# Patient Record
Sex: Female | Born: 1950 | Race: White | Hispanic: No | Marital: Married | Smoking: Former smoker
Health system: Southern US, Community
[De-identification: ages and names within clinical notes are randomized; demographics above are authoritative.]

## PROBLEM LIST (undated history)

## (undated) DIAGNOSIS — F419 Anxiety disorder, unspecified: Secondary | ICD-10-CM

## (undated) DIAGNOSIS — G43909 Migraine, unspecified, not intractable, without status migrainosus: Secondary | ICD-10-CM

## (undated) DIAGNOSIS — K589 Irritable bowel syndrome without diarrhea: Secondary | ICD-10-CM

## (undated) DIAGNOSIS — F5104 Psychophysiologic insomnia: Secondary | ICD-10-CM

## (undated) DIAGNOSIS — E7800 Pure hypercholesterolemia, unspecified: Secondary | ICD-10-CM

## (undated) DIAGNOSIS — G44209 Tension-type headache, unspecified, not intractable: Secondary | ICD-10-CM

## (undated) DIAGNOSIS — D649 Anemia, unspecified: Secondary | ICD-10-CM

## (undated) DIAGNOSIS — N83202 Unspecified ovarian cyst, left side: Principal | ICD-10-CM

## (undated) DIAGNOSIS — J40 Bronchitis, not specified as acute or chronic: Secondary | ICD-10-CM

## (undated) DIAGNOSIS — G479 Sleep disorder, unspecified: Secondary | ICD-10-CM

## (undated) DIAGNOSIS — E041 Nontoxic single thyroid nodule: Secondary | ICD-10-CM

## (undated) DIAGNOSIS — M810 Age-related osteoporosis without current pathological fracture: Secondary | ICD-10-CM

## (undated) DIAGNOSIS — G44229 Chronic tension-type headache, not intractable: Secondary | ICD-10-CM

## (undated) DIAGNOSIS — N200 Calculus of kidney: Secondary | ICD-10-CM

## (undated) HISTORY — PX: BREAST BIOPSY: SHX20

## (undated) HISTORY — DX: Migraine, unspecified, not intractable, without status migrainosus: G43.909

## (undated) HISTORY — DX: Tension-type headache, unspecified, not intractable: G44.209

## (undated) HISTORY — DX: Irritable bowel syndrome, unspecified: K58.9

## (undated) HISTORY — DX: Chronic tension-type headache, not intractable: G44.229

## (undated) HISTORY — DX: Bronchitis, not specified as acute or chronic: J40

## (undated) HISTORY — DX: Nontoxic single thyroid nodule: E04.1

## (undated) HISTORY — DX: Pure hypercholesterolemia, unspecified: E78.00

## (undated) HISTORY — DX: Calculus of kidney: N20.0

## (undated) HISTORY — DX: Psychophysiologic insomnia: F51.04

## (undated) HISTORY — DX: Sleep disorder, unspecified: G47.9

## (undated) HISTORY — PX: BREAST EXCISIONAL BIOPSY: SUR124

## (undated) HISTORY — DX: Age-related osteoporosis without current pathological fracture: M81.0

## (undated) HISTORY — PX: EYE SURGERY: SHX253

## (undated) HISTORY — DX: Anemia, unspecified: D64.9

## (undated) HISTORY — DX: Unspecified ovarian cyst, left side: N83.202

## (undated) HISTORY — DX: Anxiety disorder, unspecified: F41.9

---

## 1998-03-23 ENCOUNTER — Other Ambulatory Visit: Admission: RE | Admit: 1998-03-23 | Discharge: 1998-03-23 | Payer: Self-pay | Admitting: Obstetrics and Gynecology

## 1999-07-08 ENCOUNTER — Other Ambulatory Visit: Admission: RE | Admit: 1999-07-08 | Discharge: 1999-07-08 | Payer: Self-pay | Admitting: Obstetrics and Gynecology

## 1999-11-30 ENCOUNTER — Encounter: Admission: RE | Admit: 1999-11-30 | Discharge: 1999-11-30 | Payer: Self-pay | Admitting: Obstetrics and Gynecology

## 1999-11-30 ENCOUNTER — Encounter: Payer: Self-pay | Admitting: Obstetrics and Gynecology

## 2000-09-25 ENCOUNTER — Other Ambulatory Visit: Admission: RE | Admit: 2000-09-25 | Discharge: 2000-09-25 | Payer: Self-pay | Admitting: Obstetrics and Gynecology

## 2000-12-26 ENCOUNTER — Encounter: Admission: RE | Admit: 2000-12-26 | Discharge: 2000-12-26 | Payer: Self-pay | Admitting: Obstetrics and Gynecology

## 2000-12-26 ENCOUNTER — Encounter: Payer: Self-pay | Admitting: Obstetrics and Gynecology

## 2001-10-02 ENCOUNTER — Other Ambulatory Visit: Admission: RE | Admit: 2001-10-02 | Discharge: 2001-10-02 | Payer: Self-pay | Admitting: Obstetrics and Gynecology

## 2002-03-22 ENCOUNTER — Encounter: Payer: Self-pay | Admitting: Obstetrics and Gynecology

## 2002-03-22 ENCOUNTER — Encounter: Admission: RE | Admit: 2002-03-22 | Discharge: 2002-03-22 | Payer: Self-pay | Admitting: Obstetrics and Gynecology

## 2002-10-24 ENCOUNTER — Other Ambulatory Visit: Admission: RE | Admit: 2002-10-24 | Discharge: 2002-10-24 | Payer: Self-pay | Admitting: Obstetrics and Gynecology

## 2003-11-26 ENCOUNTER — Encounter: Admission: RE | Admit: 2003-11-26 | Discharge: 2003-11-26 | Payer: Self-pay | Admitting: Obstetrics and Gynecology

## 2003-12-09 ENCOUNTER — Other Ambulatory Visit: Admission: RE | Admit: 2003-12-09 | Discharge: 2003-12-09 | Payer: Self-pay | Admitting: Obstetrics and Gynecology

## 2004-12-28 ENCOUNTER — Encounter: Admission: RE | Admit: 2004-12-28 | Discharge: 2004-12-28 | Payer: Self-pay | Admitting: Obstetrics and Gynecology

## 2006-01-23 ENCOUNTER — Encounter: Admission: RE | Admit: 2006-01-23 | Discharge: 2006-01-23 | Payer: Self-pay | Admitting: Obstetrics and Gynecology

## 2007-03-21 ENCOUNTER — Other Ambulatory Visit: Admission: RE | Admit: 2007-03-21 | Discharge: 2007-03-21 | Payer: Self-pay | Admitting: Obstetrics and Gynecology

## 2007-04-04 ENCOUNTER — Encounter: Admission: RE | Admit: 2007-04-04 | Discharge: 2007-04-04 | Payer: Self-pay | Admitting: Obstetrics and Gynecology

## 2008-03-31 ENCOUNTER — Other Ambulatory Visit: Admission: RE | Admit: 2008-03-31 | Discharge: 2008-03-31 | Payer: Self-pay | Admitting: Obstetrics and Gynecology

## 2008-04-15 ENCOUNTER — Encounter: Admission: RE | Admit: 2008-04-15 | Discharge: 2008-04-15 | Payer: Self-pay | Admitting: Obstetrics and Gynecology

## 2009-08-06 ENCOUNTER — Encounter: Admission: RE | Admit: 2009-08-06 | Discharge: 2009-08-06 | Payer: Self-pay | Admitting: Obstetrics and Gynecology

## 2009-10-30 ENCOUNTER — Encounter: Admission: RE | Admit: 2009-10-30 | Discharge: 2009-10-30 | Payer: Self-pay | Admitting: Family Medicine

## 2010-09-16 ENCOUNTER — Encounter: Admission: RE | Admit: 2010-09-16 | Discharge: 2010-09-16 | Payer: Self-pay | Admitting: Obstetrics and Gynecology

## 2010-12-12 ENCOUNTER — Encounter: Payer: Self-pay | Admitting: Obstetrics and Gynecology

## 2011-01-04 ENCOUNTER — Other Ambulatory Visit: Payer: Self-pay | Admitting: Internal Medicine

## 2011-01-04 DIAGNOSIS — E042 Nontoxic multinodular goiter: Secondary | ICD-10-CM

## 2011-01-13 ENCOUNTER — Other Ambulatory Visit: Payer: Self-pay

## 2011-09-26 ENCOUNTER — Other Ambulatory Visit: Payer: Self-pay | Admitting: Obstetrics and Gynecology

## 2011-09-26 DIAGNOSIS — Z1231 Encounter for screening mammogram for malignant neoplasm of breast: Secondary | ICD-10-CM

## 2011-09-27 ENCOUNTER — Ambulatory Visit: Payer: Self-pay

## 2011-12-06 ENCOUNTER — Ambulatory Visit: Payer: Self-pay

## 2011-12-13 ENCOUNTER — Ambulatory Visit
Admission: RE | Admit: 2011-12-13 | Discharge: 2011-12-13 | Disposition: A | Discharge status: Home / Self Care | Payer: BC Managed Care – PPO | Source: Ambulatory Visit | Attending: Obstetrics and Gynecology | Admitting: Obstetrics and Gynecology

## 2011-12-13 DIAGNOSIS — Z1231 Encounter for screening mammogram for malignant neoplasm of breast: Secondary | ICD-10-CM

## 2013-05-28 ENCOUNTER — Encounter: Payer: Self-pay | Admitting: Obstetrics & Gynecology

## 2013-05-29 ENCOUNTER — Ambulatory Visit: Payer: Self-pay | Admitting: Obstetrics & Gynecology

## 2013-05-29 ENCOUNTER — Telehealth: Payer: Self-pay | Admitting: Obstetrics & Gynecology

## 2013-05-29 NOTE — Telephone Encounter (Signed)
Patient's husband cancelled her appointment for today with Dr. Hyacinth Meeker for aex due to patient was admitted to the hospital yesterday. Patient's husband did not give details but stated she is stabile and will call to reschedule when she get home. Patient in recall.

## 2013-05-31 ENCOUNTER — Ambulatory Visit: Payer: Self-pay | Admitting: Obstetrics & Gynecology

## 2013-06-26 ENCOUNTER — Encounter: Payer: Self-pay | Admitting: Obstetrics and Gynecology

## 2013-06-26 ENCOUNTER — Ambulatory Visit (INDEPENDENT_AMBULATORY_CARE_PROVIDER_SITE_OTHER): Payer: BC Managed Care – PPO | Admitting: Obstetrics and Gynecology

## 2013-06-26 VITALS — BP 112/60 | HR 72 | Ht 64.25 in | Wt 119.5 lb

## 2013-06-26 DIAGNOSIS — N83209 Unspecified ovarian cyst, unspecified side: Secondary | ICD-10-CM

## 2013-06-26 DIAGNOSIS — Z0000 Encounter for general adult medical examination without abnormal findings: Secondary | ICD-10-CM

## 2013-06-26 DIAGNOSIS — Z01419 Encounter for gynecological examination (general) (routine) without abnormal findings: Secondary | ICD-10-CM

## 2013-06-26 LAB — POCT URINALYSIS DIPSTICK
Glucose, UA: NEGATIVE
Nitrite, UA: NEGATIVE
Urobilinogen, UA: NEGATIVE

## 2013-06-26 NOTE — Progress Notes (Signed)
Patient ID: Chelsea Lowe, female   DOB: 11/27/1950, 62 y.o.   MRN: [id number] 62 y.o.   Married    Caucasian   female   [phone]   here for annual exam.    Had a CT scan following colonoscopy for which patient had significant post procedure pain.  Showed an ovarian cyst.  Patient is asking to wait to do this follow up.   Nocturia every 2 hours during the night.  During the day able to go longer in between visits.   No bladder urgency. No urinary incontinence.   Drinks water.  [place]zer water use during the last 4 months.   States a history of possible glaucoma.   Has an appointment with her opthalmologist next week.    No history of an arrythmia.    Some difficulty sleeping.  Denies caffeine use and ETOH use.   No hot flashes.   Asking for refill on Ambien. We filled it in 2013.  Patient had called for a refill and we declined without an office visit due to her rapid use of the medication. She then filled with her PCP.  Patient's last menstrual period was 11/21/2001.          Sexually active: no  The current method of family planning is post menopausal status.    Exercising: walking, hiking and biking Last mammogram:  11/2011 wnl:[facility] Last pap smear: 04/2012 wnl:Neg HR HPV History of abnormal pap: no Smoking: no Alcohol: 2 glasses red wine per week. Last colonoscopy: 05-28-13 wnl:Dr. Roxan Hockey in [place].  Patient developed severe pain day after procedure, had CT-Scan which did not reveal perforation but revealed ovarian cyst and was told to follow-up with GYN. Last Bone Density:  2013--Osteoporosis:[facility].  Follow by her PCP for this.  Patient is doing two injections per year.   Last tetanus shot: unsure but sees PCP yearly Last cholesterol check:  10/2012 wnl  Urine: Neg  Labs with her PCP   Family History  Problem Relation Age of Onset  . Cancer Maternal Grandfather     ? liver  . Hypertension Mother   . Transient ischemic attack Mother   .  Hypertension Father     There are no active problems to display for this patient.   Past Medical History  Diagnosis Date  . Kidney stones     h/o  . Migraines     h/o  . IBS (irritable bowel syndrome)     and colitis  . Osteoporosis     followed by PCP-on prolia  . Anemia     h/o    Past Surgical History  Procedure Laterality Date  . Breast biopsy      bilateral    Allergies: Codeine  Current Outpatient Prescriptions  Medication Sig Dispense Refill  . aspirin 81 MG tablet Take 81 mg by mouth daily.      Marland Kitchen CALCIUM PO Take by mouth 2 (two) times daily.      . cholecalciferol (VITAMIN D) 1000 UNITS tablet Take 1,000 Units by mouth daily.      Marland Kitchen denosumab (PROLIA) 60 MG/ML SOLN injection Inject 60 mg into the skin every 6 (six) months. Administer in upper arm, thigh, or abdomen      . Multiple Vitamins-Minerals (MULTIVITAMIN PO) Take by mouth daily.      . Omega-3 Fatty Acids (FISH OIL PO) Take by mouth 3 (three) times daily.      Marland Kitchen zolpidem (AMBIEN CR) 6.25 MG  CR tablet Take 6.25 mg by mouth at bedtime as needed for sleep.      Marland Kitchen MELATONIN PO Take by mouth. Occasionally       No current facility-administered medications for this visit.    ROS: Pertinent items are noted in HPI.  Social Hx:  Retired.  One child 28 years old, living in [place].  Exam:    BP 112/60  Pulse 72  Ht 5' 4.25" (1.632 m)  Wt 119 lb 8 oz (54.205 kg)  BMI 20.35 kg/m2  LMP 11/21/2001   Wt Readings from Last 3 Encounters:  06/26/13 119 lb 8 oz (54.205 kg)     Ht Readings from Last 3 Encounters:  06/26/13 5' 4.25" (1.632 m)    General appearance: alert, cooperative and appears stated age Head: Normocephalic, without obvious abnormality, atraumatic Neck: no adenopathy, supple, symmetrical, trachea midline and thyroid not enlarged, symmetric, no tenderness/mass/nodules Lungs: clear to auscultation bilaterally Breasts: Inspection negative, No nipple retraction or dimpling, No nipple  discharge or bleeding, No axillary or supraclavicular adenopathy, Normal to palpation without dominant masses.  Scars of both breasts upper/outer quadrants. Heart: regular rate and rhythm Abdomen: soft, non-tender;  no masses,  no organomegaly Extremities: extremities normal, atraumatic, no cyanosis or edema Skin: Skin color, texture, turgor normal. No rashes or lesions Lymph nodes: Cervical, supraclavicular, and axillary nodes normal. No abnormal inguinal nodes palpated Neurologic: Grossly normal   Pelvic: External genitalia:  no lesions              Urethra:  normal appearing urethra with no masses, tenderness or lesions              Bartholins and Skenes: normal                 Vagina: normal appearing vagina with normal color and discharge, no lesions              Cervix: normal appearance              Pap taken: no        Bimanual Exam:  Uterus:  uterus is normal size, shape, consistency and nontender                                      Adnexa: normal adnexa in size, nontender and no masses                                      Rectovaginal: Confirms                                      Anus:  normal sphincter tone, no lesions  A: normal menopausal exam Insomnia Nocturia Ovarian cyst History of gluacoma       P[facility]  Order placed. Patient will call. pap smear in 2018.  Patient informed. Return for pelvic ultrasound Patient will get CT scan report for [place].  Patient will check with her opthalmologist regarding her glaucoma history. If she does not have narrow angle glaucoma, will prescribe medication for overeactive bladder such as Detrol or Ditropan. Sleep meds to be given through one prescriber - PCP  return annually or prn  An After Visit Summary was printed and given to the patient.

## 2013-06-26 NOTE — Patient Instructions (Signed)

## 2013-07-05 ENCOUNTER — Telehealth: Payer: Self-pay | Admitting: Obstetrics and Gynecology

## 2013-07-05 NOTE — Telephone Encounter (Signed)
Spoke with patient about her ins benefits for a PUS. Patient agreed and said she will call back on Monday to schedule. Her husband has an appointment on Monday to find out when his surgery will be. She wants to have his surgery scheduled before scheduling a PUS.

## 2013-07-16 ENCOUNTER — Ambulatory Visit: Payer: BC Managed Care – PPO

## 2013-07-19 NOTE — Telephone Encounter (Signed)
Patient scheduled 9/11/4

## 2013-08-01 ENCOUNTER — Encounter: Payer: Self-pay | Admitting: Obstetrics and Gynecology

## 2013-08-01 ENCOUNTER — Ambulatory Visit (INDEPENDENT_AMBULATORY_CARE_PROVIDER_SITE_OTHER): Payer: BC Managed Care – PPO | Admitting: Obstetrics and Gynecology

## 2013-08-01 ENCOUNTER — Ambulatory Visit (INDEPENDENT_AMBULATORY_CARE_PROVIDER_SITE_OTHER): Payer: BC Managed Care – PPO

## 2013-08-01 VITALS — BP 118/68 | HR 80 | Ht 64.25 in | Wt 121.0 lb

## 2013-08-01 DIAGNOSIS — N83209 Unspecified ovarian cyst, unspecified side: Secondary | ICD-10-CM

## 2013-08-01 DIAGNOSIS — N3281 Overactive bladder: Secondary | ICD-10-CM

## 2013-08-01 DIAGNOSIS — N83202 Unspecified ovarian cyst, left side: Secondary | ICD-10-CM

## 2013-08-01 DIAGNOSIS — N318 Other neuromuscular dysfunction of bladder: Secondary | ICD-10-CM

## 2013-08-01 MED ORDER — OXYBUTYNIN CHLORIDE ER 5 MG PO TB24: 5.0000 mg | ORAL_TABLET | Freq: Every day | ORAL | Status: DC

## 2013-08-01 NOTE — Patient Instructions (Addendum)
Oxybutynin extended-release tablets What is this medicine? OXYBUTYNIN (ox i BYOO ti nin) is used to treat overactive bladder. This medicine reduces the amount of bathroom visits. It may also help to control wetting accidents. This medicine may be used for other purposes; ask your health care provider or pharmacist if you have questions. What should I tell my health care provider before I take this medicine? They need to know if you have any of these conditions: -dementia -difficulty passing urine -glaucoma -intestinal obstruction -kidney disease -liver disease -an unusual or allergic reaction to oxybutynin, other medicines, foods, dyes, or preservatives -pregnant or trying to get pregnant -breast-feeding How should I use this medicine? Take this medicine by mouth with a glass of water. Swallow whole, do not crush, cut, or chew. Follow the directions on the prescription label. You can take this medicine with or without food. Take your doses at regular intervals. Do not take your medicine more often than directed. Talk to your pediatrician regarding the use of this medicine in children. Special care may be needed. While this drug may be prescribed for children as young as 6 years for selected conditions, precautions do apply. Overdosage: If you think you have taken too much of this medicine contact a poison control center or emergency room at once. NOTE: This medicine is only for you. Do not share this medicine with others. What if I miss a dose? If you miss a dose, take it as soon as you can. If it is almost time for your next dose, take only that dose. Do not take double or extra doses. What may interact with this medicine? -antihistamines for allergy, cough and cold -atropine -certain medicines for bladder problems like oxybutynin, tolterodine -certain medicines for Parkinson's disease like benztropine, trihexyphenidyl -certain medicines for stomach problems like dicyclomine,  hyoscyamine -certain medicines for travel sickness like scopolamine -clarithromycin -erythromycin -ipratropium -medicines for fungal infections, like fluconazole, itraconazole, ketoconazole or voriconazole This list may not describe all possible interactions. Give your health care provider a list of all the medicines, herbs, non-prescription drugs, or dietary supplements you use. Also tell them if you smoke, drink alcohol, or use illegal drugs. Some items may interact with your medicine. What should I watch for while using this medicine? It may take a few weeks to notice the full benefit from this medicine. You may need to limit your intake tea, coffee, caffeinated sodas, and alcohol. These drinks may make your symptoms worse. You may get drowsy or dizzy. Do not drive, use machinery, or do anything that needs mental alertness until you know how this medicine affects you. Do not stand or sit up quickly, especially if you are an older patient. This reduces the risk of dizzy or fainting spells. Alcohol may interfere with the effect of this medicine. Avoid alcoholic drinks. Your mouth may get dry. Chewing sugarless gum or sucking hard candy, and drinking plenty of water may help. Contact your doctor if the problem does not go away or is severe. This medicine may cause dry eyes and blurred vision. If you wear contact lenses, you may feel some discomfort. Lubricating drops may help. See your eyecare professional if the problem does not go away or is severe. You may notice the shells of the tablets in your stool from time to time. This is normal. Avoid extreme heat. This medicine can cause you to sweat less than normal. Your body temperature could increase to dangerous levels, which may lead to heat stroke. What side effects may  I notice from receiving this medicine? Side effects that you should report to your doctor or health care professional as soon as possible: -allergic reactions like skin rash,  itching or hives, swelling of the face, lips, or tongue -agitation -breathing problems -confusion -fever -flushing (reddening of the skin) -hallucinations -memory loss -pain or difficulty passing urine -palpitations -unusually weak or tired Side effects that usually do not require medical attention (report to your doctor or health care professional if they continue or are bothersome): -constipation -headache -sexual difficulties (impotence) This list may not describe all possible side effects. Call your doctor for medical advice about side effects. You may report side effects to FDA at 1-800-FDA-1088. Where should I keep my medicine? Keep out of the reach of children. Store at room temperature between 15 and 30 degrees C (59 and 86 degrees F). Protect from moisture and humidity. Throw away any unused medicine after the expiration date. NOTE: This sheet is a summary. It may not cover all possible information. If you have questions about this medicine, talk to your doctor, pharmacist, or health care provider.  2013, [employer]. (06/04/2008 11:11:20 AM)  Overactive Bladder, Adult The bladder has two functions that are totally opposite of the other. One is to relax and stretch out so it can store urine (fills like a balloon), and the other is to contract and squeeze down so that it can empty the urine that it has stored. Proper functioning of the bladder is a complex mixing of these two functions. The filling and emptying of the bladder can be influenced by:  The bladder.  The spinal cord.  The brain.  The nerves going to the bladder.  Other organs that are closely related to the bladder such as prostate in males and the vagina in females. As your bladder fills with urine, nerve signals are sent from the bladder to the brain to tell you that you may need to urinate. Normal urination requires that the bladder squeeze down with sufficient strength to empty the bladder, but  this also requires that the bladder squeeze down sufficiently long to finish the job. In addition the sphincter muscles, which normally keep you from leaking urine, must also relax so that the urine can pass. Coordination between the bladder muscle squeezing down and the sphincter muscles relaxing is required to make everything happen normally. With an overactive bladder sometimes the muscles of the bladder contract unexpectedly and involuntarily and this causes an urgent need to urinate. The normal response is to try to hold urine in by contracting the sphincter muscles. Sometimes the bladder contracts so strongly that the sphincter muscles cannot stop the urine from passing out and incontinence occurs. This kind of incontinence is called urge incontinence. Having an overactive bladder can be embarrassing and awkward. It can keep you from living life the way you want to. Many people think it is just something you have to put up with as you grow older or have certain health conditions. In fact, there are treatments that can help make your life easier and more pleasant. CAUSES  Many things can cause an overactive bladder. Possibilities include:  Urinary tract infection or infection of nearby tissues such as the prostate.  Prostate enlargement.  In women, multiple pregnancies or surgery on the uterus or urethra.  Bladder stones, inflammation or tumors.  Caffeine.  Alcohol.  Medications. For example, diuretics (drugs that help the body get rid of extra fluid) increase urine production. Some other medicines must be taken  with lots of fluids.  Muscle or nerve weakness. This might be the result of a spinal cord injury, a stroke, multiple sclerosis or Parkinson's disease.  Diabetes can cause a high urine volume which fills the bladder so quickly that the normal urge to urinate is triggered very strongly. SYMPTOMS   Loss of bladder control. You feel the need to urinate and cannot make your body  wait.  Sudden, strong urges to urinate.  Urinating 8 or more times a day.  Waking up to urinate two or more times a night. DIAGNOSIS  To decide if you have overactive bladder, your healthcare provider will probably:  Ask about symptoms you have noticed.  Ask about your overall health. This will include questions about any medications you are taking.  Do a physical examination. This will help determine if there are obvious blockages or other problems.  Order some tests. These might include:  A blood test to check for diabetes or other health issues that could be contributing to the problem.  Urine testing. This could measure the flow of urine and the pressure on the bladder.  A test of your neurological system (the brain, spinal cord and nerves). This is the system that senses the need to urinate. Some of these tests are called flow tests, bladder pressure tests and electrical measurements of the sphincter muscle.  A bladder test to check whether it is emptying completely when you urinate.  Cytoscopy. This test uses a thin tube with a tiny camera on it. It offers a look inside your urethra and bladder to see if there are problems.  Imaging tests. You might be given a contrast dye and then asked to urinate. X-rays are taken to see how your bladder is working. TREATMENT  An overactive bladder can be treated in many ways. The treatment will depend on the cause. Whether you have a mild or severe case also makes a difference. Often, treatment can be given in your healthcare provider's office or clinic. Be sure to discuss the different options with your caregiver. They include:  Behavioral treatments. These do not involve medication or surgery:  Bladder training. For this, you would follow a schedule to urinate at regular intervals. This helps you learn to control the urge to urinate. At first, you might be asked to wait a few minutes after feeling the urge. In time, you should be able to  schedule bathroom visits an hour or more apart.  Kegel exercises. These exercises strengthen the pelvic floor muscles, which support the bladder. By toning these muscles, they can help control urination, even if the bladder muscles are overactive. A specialist will teach you how to do these exercises correctly. They will require daily practice.  Weight loss. If you are obese or overweight, losing weight might stop your bladder from being overactive. Talk to your healthcare provider about how many pounds you should lose. Also ask if there is a specific program or method that would work best for you.  Diet change. This might be suggested if constipation is making your overactive bladder worse. Your healthcare provider or a nutritionist can explain ways to change what you eat to ease constipation. Other people might need to take in less caffeine or alcohol. Sometimes drinking fewer fluids is needed, too.  Protection. This is not an actual treatment. But, you could wear special pads to take care of any leakage while you wait for other treatments to take effect. This will help you avoid embarrassment.  Physical treatments.  Electrical stimulation. Electrodes will send gentle pulses to the nerves or muscles that help control the bladder. The goal is to strengthen them. Sometimes this is done with the electrodes outside of the body. Or, they might be placed inside the body (implanted). This treatment can take several months to have an effect.  Medications. These are usually used along with other treatments. Several medicines are available. Some are injected into the muscles involved in urination. Others come in pill form. Medications sometimes prescribed include:  Anticholinergics. These drugs block the signals that the nerves deliver to the bladder. This keeps it from releasing urine at the wrong time. Researchers think the drugs might help in other ways, too.  Imipramine. This is an antidepressant. But,  it relaxes bladder muscles.  Botox. This is still experimental. Some people believe that injecting it into the bladder muscles will relax them so they work more normally. It has also been injected into the sphincter muscle when the sphincter muscle does not open properly. This is a temporary fix, however. Also, it might make matters worse, especially in older people.  Surgery.  A device might be implanted to help manage your nerves. It works on the nerves that signal when you need to urinate.  Surgery is sometimes needed with electrical stimulation. If the electrodes are implanted, this is done through surgery.  Sometimes repairs need to be made through surgery. For example, the size of the bladder can be changed. This is usually done in severe cases only. HOME CARE INSTRUCTIONS   Take any medications your healthcare provider prescribed or suggested. Follow the directions carefully.  Practice any lifestyle changes that are recommended. These might include:  Drinking less fluid or drinking at different times of the day. If you need to urinate often during the night, for example, you may need to stop drinking fluids early in the evening.  Cutting down on caffeine or alcohol. They can both make an overactive bladder worse. Caffeine is found in coffee, tea and sodas.  Doing Kegel exercises to strengthen muscles.  Losing weight, if that is recommended.  Eating a healthy and balanced diet. This will help you avoid constipation.  Keep a journal or a log. You might be asked to record how much you drink and when, and also when you feel the need to urinate.  Learn how to care for implants or other devices, such as pessaries. SEEK MEDICAL CARE IF:   Your overactive bladder gets worse.  You feel increased pain or irritation when you urinate.  You notice blood in your urine.  You have questions about any medications or devices that your healthcare provider recommended.  You notice blood,  pus or swelling at the site of any test or treatment procedure.  You have an oral temperature above 102 F (38.9 C). SEEK IMMEDIATE MEDICAL CARE IF:  You have an oral temperature above 102 F (38.9 C), not controlled by medicine. Document Released: 09/03/2009 Document Revised: 01/30/2012 Document Reviewed: 09/03/2009 [facility] Patient Information 2014 [place], [place].

## 2013-08-01 NOTE — Progress Notes (Signed)
Subjective  Patient is here for pelvic ultrasound in follow up to a 2.5 cm left ovarian cyst seen at time of a CT scan 05/28/13 - performed following a colonoscopy.  Patient is also to discuss medication for overactive bladder syndrome.  Has night time frequency - 6 times night.  Voids 13 - 14 times in a 24 hour period.  Not sleeping well.  Taking Ambien sometimes.  No history of glaucoma.   Drinks very little soda.  Drinks green tea.  Drinks Charity fundraiser.   History of headaches.   Objective  See ultrasound findings below - No change in the simple left ovarian cyst - 2.6 x 2.1 cm.  No free fluid.  Normal uterus.      Assessment  Simple left ovarian cyst - unchanged. Overactive bladder, disrupting sleep pattern.  Plan  CA125. If normal, plan for next ultrasound in one year.  Ditropan XL 5 mg daily.  See Epic orders.  I discussed side effects. Follow up in 6 weeks.

## 2013-08-02 LAB — CA 125: CA 125: 6.6 U/mL (ref 0.0–30.2)

## 2014-01-28 ENCOUNTER — Ambulatory Visit: Payer: BC Managed Care – PPO

## 2014-02-04 ENCOUNTER — Ambulatory Visit
Admission: RE | Admit: 2014-02-04 | Discharge: 2014-02-04 | Disposition: A | Discharge status: Home / Self Care | Payer: BC Managed Care – PPO | Source: Ambulatory Visit | Attending: Obstetrics and Gynecology | Admitting: Obstetrics and Gynecology

## 2014-02-04 DIAGNOSIS — Z01419 Encounter for gynecological examination (general) (routine) without abnormal findings: Secondary | ICD-10-CM

## 2014-04-18 ENCOUNTER — Encounter: Payer: Self-pay | Admitting: Obstetrics and Gynecology

## 2014-04-18 ENCOUNTER — Ambulatory Visit (INDEPENDENT_AMBULATORY_CARE_PROVIDER_SITE_OTHER): Payer: BC Managed Care – PPO | Admitting: Obstetrics and Gynecology

## 2014-04-18 VITALS — BP 100/66 | HR 80 | Ht 64.0 in | Wt 118.8 lb

## 2014-04-18 DIAGNOSIS — N83209 Unspecified ovarian cyst, unspecified side: Secondary | ICD-10-CM

## 2014-04-18 DIAGNOSIS — N83202 Unspecified ovarian cyst, left side: Secondary | ICD-10-CM

## 2014-04-18 DIAGNOSIS — Z01419 Encounter for gynecological examination (general) (routine) without abnormal findings: Secondary | ICD-10-CM

## 2014-04-18 DIAGNOSIS — Z0000 Encounter for general adult medical examination without abnormal findings: Secondary | ICD-10-CM

## 2014-04-18 LAB — POCT URINALYSIS DIPSTICK
BILIRUBIN UA: NEGATIVE
Blood, UA: NEGATIVE
GLUCOSE UA: NEGATIVE
Ketones, UA: NEGATIVE
Leukocytes, UA: NEGATIVE
NITRITE UA: NEGATIVE
Protein, UA: NEGATIVE
UROBILINOGEN UA: NEGATIVE
pH, UA: 5

## 2014-04-18 MED ORDER — OXYBUTYNIN CHLORIDE ER 5 MG PO TB24: 5.0000 mg | ORAL_TABLET | Freq: Every day | ORAL | Status: DC

## 2014-04-18 NOTE — Progress Notes (Signed)
Patient ID: Chelsea Lowe, female   DOB: 08/29/1951, 63 y.o.   MRN: [id number] GYNECOLOGY VISIT  PCP:   Marinda Elk, MD  Referring provider:   HPI: 63 y.o.   Married  Caucasian  female   [phone] with Patient's last menstrual period was 11/21/2001.   here for   AEX.  Having sleep issues. If does not take sleeping pills, cannot sleep. Only sleeps four hours, even if takes a sleeping pill. Difficulty turning her brain off.  Family health issues.  Does exercise.   Has poor nail quality.  Had normal thyroid function.   Having nocturia.  Uncertain if bladder is waking her up or if she really needs to empty. Took Oxybutnyn for 4 weeks, no help so she stopped.  Was on 5 mg.  Wants to retry.   Has a simple left ovarian cyst noted on CT and then confirmed on ultrasound.  Normal CA 125. Plan to do yearly ultrasound.   Hgb:    PCP Urine:  Neg  GYNECOLOGIC HISTORY: Patient's last menstrual period was 11/21/2001. Sexually active:  no Partner preference: female Contraception: postmenopausal   Menopausal hormone therapy: no DES exposure:   no Blood transfusions:  no  Sexually transmitted diseases:   no GYN procedures and prior surgeries:  no Last mammogram:  02-04-14 wnl:[facility]               Last pap and high risk HPV testing:   04/2012 wnl:neg HR HPV History of abnormal pap smear:  no   OB History   Grav Para Term Preterm Abortions TAB SAB Ect Mult Living   4 1   3  3   1        LIFESTYLE: Exercise:  Cardio, strength, walking and zumba             Tobacco:   no Alcohol:      4 glasses of wine per week Drug use:   no  OTHER HEALTH MAINTENANCE: Tetanus/TDap:   Up to date with PCP Gardisil:               n/a Influenza:             08/2013 Zostavax:              unsure  Bone density:       2014 ? Osteopenia:[facility]--Hx of Osteoporosis.  History  Of Fosamax and Prolia use. PCP prescribing.  Colonoscopy:       05/2013 wnl with [facility].  Due to history of colon polyps but will need another colonoscopy in 05/2018.  Cholesterol check:  Borderline with PCP  Family History  Problem Relation Age of Onset  . Cancer Maternal Grandfather     ? liver  . Hypertension Mother   . Transient ischemic attack Mother   . Hypertension Father     Patient Active Problem List   Diagnosis Date Noted  . Left ovarian cyst 08/01/2013  . Overactive bladder 08/01/2013   Past Medical History  Diagnosis Date  . Kidney stones     h/o  . Migraines     h/o  . IBS (irritable bowel syndrome)     and colitis  . Osteoporosis     followed by PCP-on prolia  . Anemia     h/o    Past Surgical History  Procedure Laterality Date  . Breast biopsy      bilateral    ALLERGIES: Codeine  Current  Outpatient Prescriptions  Medication Sig Dispense Refill  . aspirin 81 MG tablet Take 81 mg by mouth daily.      Marland Kitchen. CALCIUM PO Take by mouth 2 (two) times daily.      . cholecalciferol (VITAMIN D) 1000 UNITS tablet Take 1,000 Units by mouth daily.      Marland Kitchen. denosumab (PROLIA) 60 MG/ML SOLN injection Inject 60 mg into the skin every 6 (six) months. Administer in upper arm, thigh, or abdomen      . Multiple Vitamins-Minerals (MULTIVITAMIN PO) Take by mouth daily.      . Omega-3 Fatty Acids (FISH OIL PO) Take by mouth 3 (three) times daily.      Marland Kitchen. zolpidem (AMBIEN CR) 6.25 MG CR tablet Take 6.25 mg by mouth at bedtime as needed for sleep.       No current facility-administered medications for this visit.     ROS:  Pertinent items are noted in HPI.  SOCIAL HISTORY:    PHYSICAL EXAMINATION:    BP 100/66  Pulse 80  Ht 5\' 4"  (1.626 m)  Wt 118 lb 12.8 oz (53.887 kg)  BMI 20.38 kg/m2  LMP 11/21/2001   Wt Readings from Last 3 Encounters:  04/18/14 118 lb 12.8 oz (53.887 kg)  08/01/13 121 lb (54.885 kg)  06/26/13 119 lb 8 oz (54.205 kg)     Ht Readings from Last 3 Encounters:  04/18/14 5\' 4"  (1.626 m)  08/01/13 5' 4.25" (1.632 m)  06/26/13  5' 4.25" (1.632 m)    General appearance: alert, cooperative and appears stated age Head: Normocephalic, without obvious abnormality, atraumatic Neck: no adenopathy, supple, symmetrical, trachea midline and thyroid not enlarged, symmetric, no tenderness/mass/nodules Lungs: clear to auscultation bilaterally Breasts: Inspection negative, No nipple retraction or dimpling, No nipple discharge or bleeding, No axillary or supraclavicular adenopathy, Normal to palpation without dominant masses Heart: regular rate and rhythm Abdomen: soft, non-tender; no masses,  no organomegaly Extremities: extremities normal, atraumatic, no cyanosis or edema Skin: Skin color, texture, turgor normal. No rashes or lesions Lymph nodes: Cervical, supraclavicular, and axillary nodes normal. No abnormal inguinal nodes palpated Neurologic: Grossly normal  Pelvic: External genitalia:  no lesions.  Mons pubis with 1.0 cm seborrheaic keratosis.               Urethra:  normal appearing urethra with no masses, tenderness or lesions              Bartholins and Skenes: normal                 Vagina: normal appearing vagina with normal color and discharge, no lesions              Cervix: normal appearance              Pap and high risk HPV testing done: yes.            Bimanual Exam:  Uterus:  uterus is normal size, shape, consistency and nontender                                      Adnexa: normal adnexa in size, nontender and no masses                                      Rectovaginal: Confirms  Anus:  normal sphincter tone, no lesions  ASSESSMENT  Normal gynecologic exam. Nocturia.  Simple left ovarian cyst.  Sleep disturbance.  Osteoporosis.  Followed by PCP.  PLAN  Mammogram recommended yearly.  Pap smear and high risk HPV testing performed.  Counseled on self breast exam, Calcium and vitamin D intake, exercise. Oxybutynin 5 mg daily. #30.  RF 11. Return for pelvic  ultrasound.  Follow up with PCP regarding sleep disturbance.  Consider sleep study.  Return annually or prn   An After Visit Summary was printed and given to the patient.

## 2014-04-22 ENCOUNTER — Telehealth: Payer: Self-pay | Admitting: Obstetrics and Gynecology

## 2014-04-22 LAB — IPS PAP TEST WITH HPV

## 2014-04-22 NOTE — Telephone Encounter (Signed)
Spoke with patient. Advised that I had contacted her insurance plan and was told that coverage termed 05.31.2015. Patient provided me her new coverage info. She is currently covered under a [employer] plan (ID# [id number])(B/C # [phone]).

## 2014-06-27 ENCOUNTER — Ambulatory Visit: Payer: BC Managed Care – PPO | Admitting: Obstetrics and Gynecology

## 2014-07-17 ENCOUNTER — Encounter: Payer: Self-pay | Admitting: Obstetrics and Gynecology

## 2014-07-24 ENCOUNTER — Telehealth: Payer: Self-pay | Admitting: Obstetrics and Gynecology

## 2014-07-24 NOTE — Telephone Encounter (Signed)
Left message for patient to call back. Need to go over benefits and schedule PUS °

## 2014-08-05 NOTE — Telephone Encounter (Signed)
Patient returned call. Advised that per benefit quote received, she will be responsible for $69.81 when she comes in for PUS. Patient agreeable.  Scheduled PUS. Advised patient of 72 hour cancellation policy and $100 cancellation fee. Patient agreeable.

## 2014-08-14 ENCOUNTER — Telehealth: Payer: Self-pay | Admitting: Obstetrics and Gynecology

## 2014-08-14 NOTE — Telephone Encounter (Signed)
Patient called to cancel and reschedule PUS appt from 10.22.2015 because she will be out of town. Rescheduled for 11.05.2015.

## 2014-09-11 ENCOUNTER — Other Ambulatory Visit: Payer: Medicare Other

## 2014-09-11 ENCOUNTER — Other Ambulatory Visit: Payer: Medicare Other | Admitting: Obstetrics and Gynecology

## 2014-09-22 ENCOUNTER — Encounter: Payer: Self-pay | Admitting: Obstetrics and Gynecology

## 2014-09-25 ENCOUNTER — Ambulatory Visit (INDEPENDENT_AMBULATORY_CARE_PROVIDER_SITE_OTHER): Payer: Medicare Other

## 2014-09-25 ENCOUNTER — Encounter: Payer: Self-pay | Admitting: Obstetrics and Gynecology

## 2014-09-25 ENCOUNTER — Ambulatory Visit (INDEPENDENT_AMBULATORY_CARE_PROVIDER_SITE_OTHER): Payer: Medicare Other | Admitting: Obstetrics and Gynecology

## 2014-09-25 VITALS — BP 116/74 | Resp 14 | Ht 64.0 in | Wt 123.0 lb

## 2014-09-25 DIAGNOSIS — N8320 Unspecified ovarian cysts: Secondary | ICD-10-CM

## 2014-09-25 DIAGNOSIS — N83202 Unspecified ovarian cyst, left side: Secondary | ICD-10-CM

## 2014-09-25 NOTE — Progress Notes (Signed)
Subjective  Patient is her for pelvic ultrasound to check left ovarian cyst.  Does not feel cyst or pain.   2.5 cm left ovarian cyst seen at time of a CT scan 05/28/13 - performed following a colonoscopy.  Ultrasound 08/01/13  - 2.6 x 2.1 cm. No free fluid. Normal uterus.   CA125 6.6 on 08/01/13.  Stopped the oxybutynin.  Did not feel it worked at all.   Having difficulty sleeping and feels that the frequency happens just at night.  Saw PCP.  Tried Silonor and it did not help.  Had sleep walking on the Ambien but it did help with the sleep.   [place]   Objective  Ultrasound report and images reviewed with patient.  Left ovary with 22 mm simple cyst - no change in size from previous. Right ovary normal.  Uterus normal with thin endometrium and no masses.  No free fluid.      Assessment  Stable left ovarian cyst.  Sleep disturbance/insomnia.   Plan  Discussion of left ovarian cysts.  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 Return for yearly annual exams in May and then yearly ultrasound to check left ovary.  May pursue sleep study through PCP.   15 minutes face to face time of which over 50% was spent in counseling.   After visit summary to patient.

## 2015-04-24 ENCOUNTER — Ambulatory Visit: Payer: BC Managed Care – PPO | Admitting: Obstetrics and Gynecology

## 2015-04-24 ENCOUNTER — Ambulatory Visit (INDEPENDENT_AMBULATORY_CARE_PROVIDER_SITE_OTHER): Payer: Medicare Other | Admitting: Obstetrics and Gynecology

## 2015-04-24 ENCOUNTER — Encounter: Payer: Self-pay | Admitting: Obstetrics and Gynecology

## 2015-04-24 ENCOUNTER — Other Ambulatory Visit: Payer: Self-pay | Admitting: Obstetrics and Gynecology

## 2015-04-24 VITALS — BP 100/60 | HR 80 | Resp 14 | Ht 64.0 in | Wt 124.0 lb

## 2015-04-24 DIAGNOSIS — N644 Mastodynia: Secondary | ICD-10-CM

## 2015-04-24 DIAGNOSIS — Z124 Encounter for screening for malignant neoplasm of cervix: Secondary | ICD-10-CM

## 2015-04-24 DIAGNOSIS — Z01419 Encounter for gynecological examination (general) (routine) without abnormal findings: Secondary | ICD-10-CM

## 2015-04-24 DIAGNOSIS — G4700 Insomnia, unspecified: Secondary | ICD-10-CM

## 2015-04-24 NOTE — Progress Notes (Signed)
Bilateral diagnostic mammogram with right breast ultrasound scheduled for Wednesday, April 29, 2015 at 2:40 pm at [facility]. Patient agreeable to date and time.

## 2015-04-24 NOTE — Patient Instructions (Signed)

## 2015-04-24 NOTE — Progress Notes (Signed)
Patient ID: Chelsea Lowe, female   DOB: 1951-06-01, 64 y.o.   MRN: [id number] 64 y.o. [id number] Married Caucasian female here for annual exam.    Patient with multiple issues today.  1.  Right breast ache. Comes and goes infrequently.  No change in activity. No new bra.  No trauma.  2.  Headaches.  Not new. Used to have migraines. See chiropractor.  Neck arthritis.  No prior MRI. 3.  Insomnia.  Cannot stop thinking about things at night.  Trying to stop medication of sleeping medication. Dr. Foy [place] prescribing. Has not seen therapist. Considering sleep study.  4.  Blood in the stool.  History of hemorrhoids.  Last colonoscopy has severe pain and was admitted for observation and saw fluid near the colon and CT suggested potential perforation.  Declines colonoscopy in future. 5.  Urinary leakage.  Stopped all medication for OAB. Declines medication and physical therapy.  6.  Memory issues.  Feels like it is not as good. Struggles to find words.  PCP:   Dr. Marinda Elk  Patient's last menstrual period was 11/21/2001.          Sexually active: No.  The current method of family planning is post menopausal status.    Exercising: Yes.    walking and Zumba Smoker:  no  Health Maintenance: Pap:  04-18-14 WNL NEG HR HPV  History of abnormal Pap:  no MMG:  02-04-14 WNL fibroglandular density [facility] Colonoscopy:  05-28-13 polyps repeat in 5 years BMD:  ? 2014 Osteopenia -Prolia every 6 months TDaP:  Up to Date- PCP  Screening Labs:  Hb today: PCP, Urine today: PCP   reports that she has never smoked. She has never used smokeless tobacco. She reports that she drinks about 1.2 - 1.8 oz of alcohol per week. She reports that she does not use illicit drugs.  Past Medical History  Diagnosis Date  . Kidney stones     h/o  . Migraines     h/o  . IBS (irritable bowel syndrome)     and colitis  . Osteoporosis     followed by PCP-on prolia  . Anemia     h/o    Past Surgical History   Procedure Laterality Date  . Breast biopsy      bilateral    Current Outpatient Prescriptions  Medication Sig Dispense Refill  . aspirin 81 MG tablet Take 81 mg by mouth daily.    Marland Kitchen CALCIUM PO Take by mouth 2 (two) times daily.    . cholecalciferol (VITAMIN D) 1000 UNITS tablet Take 1,000 Units by mouth daily.    Marland Kitchen denosumab (PROLIA) 60 MG/ML SOLN injection Inject 60 mg into the skin every 6 (six) months. Administer in upper arm, thigh, or abdomen    . Omega-3 Fatty Acids (FISH OIL PO) Take by mouth 3 (three) times daily.    . Multiple Vitamins-Minerals (MULTIVITAMIN PO) Take by mouth daily.     No current facility-administered medications for this visit.    Family History  Problem Relation Age of Onset  . Cancer Maternal Grandfather     ? liver  . Hypertension Mother   . Transient ischemic attack Mother   . Hypertension Father     ROS:  Pertinent items are noted in HPI.  Otherwise, a comprehensive ROS was negative.  Exam:   BP 100/60 mmHg  Pulse 80  Resp 14  Ht  (1.626 m)  Wt 124 lb (56.246 kg)  BMI  21.27 kg/m2  LMP 11/21/2001    General appearance: alert, cooperative and appears stated age Head: Normocephalic, without obvious abnormality, atraumatic Neck: no adenopathy, supple, symmetrical, trachea midline and thyroid normal to inspection and palpation Lungs: clear to auscultation bilaterally Breasts: normal appearance, no masses or tenderness, Inspection negative, No nipple retraction or dimpling, No nipple discharge or bleeding, No axillary or supraclavicular adenopathy Heart: regular rate and rhythm Abdomen: soft, non-tender; bowel sounds normal; no masses,  no organomegaly Extremities: extremities normal, atraumatic, no cyanosis or edema Skin: Skin color, texture, turgor normal. No rashes or lesions Lymph nodes: Cervical, supraclavicular, and axillary nodes normal. No abnormal inguinal nodes palpated Neurologic: Grossly normal  Pelvic: External genitalia:   no lesions              Urethra:  normal appearing urethra with no masses, tenderness or lesions              Bartholins and Skenes: normal                 Vagina: normal appearing vagina with normal color and discharge, no lesions              Cervix: no lesions              Pap taken: No. Bimanual Exam:  Uterus:  normal size, contour, position, consistency, mobility, non-tender              Adnexa: normal adnexa and no mass, fullness, tenderness              Rectovaginal: Yes.  .  Confirms.              Anus:  normal sphincter tone, no lesions  Chaperone was present for exam.  Assessment:   Well woman visit with normal exam. Right breast pain.  Left ovarian mass.  Simple cyst on ultrasound. Osteoporosis.  PCP managing. Hemorrhoids and rectal bleeding Insomnia.  Chronic.  Overactive bladder.   Plan: Bilateral diagnostic mammogram and right breast ultrasound. Recommended self breast exam.  Pap and HR HPV as above. Pelvic ultrasound in November 2016.  This will be scheduled. Discussed Calcium, Vitamin D, regular exercise program including cardiovascular and weight bearing exercise. Prolia per PCP. Discussed stool softeners such as Colace.   If rectal bleeding/hemorrhoid problems persists, to PCP or GI. Labs performed.  No..   See orders. Refills given on medications.  No..  See orders. Gave name of counselor Berniece AndreasJulie Whitt. Follow up annually and prn.      After visit summary provided.

## 2015-04-28 ENCOUNTER — Other Ambulatory Visit: Payer: Self-pay | Admitting: Obstetrics and Gynecology

## 2015-04-28 ENCOUNTER — Other Ambulatory Visit: Payer: Self-pay

## 2015-04-28 DIAGNOSIS — N644 Mastodynia: Secondary | ICD-10-CM

## 2015-04-29 ENCOUNTER — Other Ambulatory Visit: Payer: Self-pay

## 2015-04-29 ENCOUNTER — Ambulatory Visit
Admission: RE | Admit: 2015-04-29 | Discharge: 2015-04-29 | Disposition: A | Discharge status: Home / Self Care | Payer: Medicare Other | Source: Ambulatory Visit | Attending: Obstetrics and Gynecology | Admitting: Obstetrics and Gynecology

## 2015-04-29 DIAGNOSIS — N644 Mastodynia: Secondary | ICD-10-CM

## 2015-11-09 ENCOUNTER — Other Ambulatory Visit: Payer: Self-pay | Admitting: Family Medicine

## 2015-11-09 DIAGNOSIS — Z1231 Encounter for screening mammogram for malignant neoplasm of breast: Secondary | ICD-10-CM

## 2015-11-22 DIAGNOSIS — N83202 Unspecified ovarian cyst, left side: Secondary | ICD-10-CM

## 2015-11-22 HISTORY — DX: Unspecified ovarian cyst, left side: N83.202

## 2016-05-02 ENCOUNTER — Ambulatory Visit: Payer: Medicare Other | Admitting: Obstetrics and Gynecology

## 2016-05-11 ENCOUNTER — Ambulatory Visit (INDEPENDENT_AMBULATORY_CARE_PROVIDER_SITE_OTHER): Payer: Medicare Other | Admitting: Obstetrics and Gynecology

## 2016-05-11 ENCOUNTER — Encounter: Payer: Self-pay | Admitting: Obstetrics and Gynecology

## 2016-05-11 VITALS — BP 112/60 | HR 76 | Resp 16 | Ht 63.75 in | Wt 123.6 lb

## 2016-05-11 DIAGNOSIS — Z0000 Encounter for general adult medical examination without abnormal findings: Secondary | ICD-10-CM

## 2016-05-11 DIAGNOSIS — Z01419 Encounter for gynecological examination (general) (routine) without abnormal findings: Secondary | ICD-10-CM

## 2016-05-11 DIAGNOSIS — N83202 Unspecified ovarian cyst, left side: Secondary | ICD-10-CM | POA: Diagnosis not present

## 2016-05-11 LAB — POCT URINALYSIS DIPSTICK
BILIRUBIN UA: NEGATIVE
Glucose, UA: NEGATIVE
KETONES UA: NEGATIVE
LEUKOCYTES UA: NEGATIVE
Nitrite, UA: NEGATIVE
PH UA: 5
Protein, UA: NEGATIVE
RBC UA: NEGATIVE
Urobilinogen, UA: NEGATIVE

## 2016-05-11 NOTE — Progress Notes (Signed)
Patient ID: Chelsea Lowe, female   DOB: December 16, 1950, 65 y.o.   MRN: [id number] 65 y.o. [id number] Married Caucasian female here for annual exam.     Off Prolia for osteoporosis.  Now has osteopenia.   Does pelvic ultrasound yearly to check left ovary.  Has 22 mm simple left ovarian cyst.  No ultrasound last year.   Some increased hair loss.  Sees dermatology next week for her annual exam.   Night time urination.  Drinks a lot of water during the day. 4 - 6 glasses. No coffee.  No sodas.  Drinks Public relations account executiveseltzer water.  Over OAB medicaition  In the past.  Did not feel it was necessary.   Will do all labs with her PCP.  PCP:  Silvestre MomentStephen Myers, MD   Patient's last menstrual period was 11/21/2001.           Sexually active: No. female The current method of family planning is post menopausal status.    Exercising: Yes.    walking and zumba Smoker:  no  Health Maintenance: Pap:  04-18-14 Neg History of abnormal Pap:  no MMG:  04-29-15 Diag.Bil./3D/Density B/stable parenchymal pattern/Neg/screening 1year/biRads1:[facility] Colonoscopy:  05-28-13 polyps with [facility];next due 05/2018. BMD:   2014  Result  Osteopenia--Hx of Osteoporosis and treatment TDaP:  Up to date with PCP Gardasil:   N/A Hep C: will discuss with PCP. Screening Labs:  Hb today: PCP, Urine today: Neg   reports that she has never smoked. She has never used smokeless tobacco. She reports that she drinks about 1.2 - 1.8 oz of alcohol per week. She reports that she does not use illicit drugs.  Past Medical History  Diagnosis Date  . Kidney stones     h/o  . Migraines     h/o  . IBS (irritable bowel syndrome)     and colitis  . Osteoporosis     followed by PCP-on prolia  . Anemia     h/o    Past Surgical History  Procedure Laterality Date  . Breast biopsy      bilateral    Current Outpatient Prescriptions  Medication Sig Dispense Refill  . amitriptyline (ELAVIL) 25 MG tablet Take 25 mg by  mouth at bedtime as needed for sleep.    Marland Kitchen. aspirin 81 MG tablet Take 81 mg by mouth daily.    Marland Kitchen. CALCIUM PO Take by mouth 2 (two) times daily.    . cholecalciferol (VITAMIN D) 1000 UNITS tablet Take 1,000 Units by mouth daily.    . Multiple Vitamins-Minerals (MULTIVITAMIN PO) Take by mouth daily.     No current facility-administered medications for this visit.    Family History  Problem Relation Age of Onset  . Cancer Maternal Grandfather     ? liver  . Hypertension Mother   . Transient ischemic attack Mother   . Hypertension Father     ROS:  Pertinent items are noted in HPI.  Otherwise, a comprehensive ROS was negative.  Exam:   BP 112/60 mmHg  Pulse 76  Resp 16  Ht 5' 3.75" (1.619 m)  Wt 123 lb 9.6 oz (56.065 kg)  BMI 21.39 kg/m2  LMP 11/21/2001    General appearance: alert, cooperative and appears stated age Head: Normocephalic, without obvious abnormality, atraumatic Neck: no adenopathy, supple, symmetrical, trachea midline and thyroid normal to inspection and palpation Lungs: clear to auscultation bilaterally Breasts: normal appearance, no masses or tenderness, Inspection negative, No nipple retraction or dimpling, No  nipple discharge or bleeding, No axillary or supraclavicular adenopathy Heart: regular rate and rhythm Abdomen:   soft, non-tender; no masses, no organomegaly Extremities: extremities normal, atraumatic, no cyanosis or edema Skin: Skin color, texture, turgor normal. No rashes or lesions Lymph nodes: Cervical, supraclavicular, and axillary nodes normal. No abnormal inguinal nodes palpated Neurologic: Grossly normal  Pelvic: External genitalia:  no lesions              Urethra:  normal appearing urethra with no masses, tenderness or lesions              Bartholins and Skenes: normal                 Vagina: normal appearing vagina with normal color and discharge, no lesions              Cervix: no lesions              Pap taken: No. Bimanual Exam:   Uterus:  normal size, contour, position, consistency, mobility, non-tender              Adnexa: normal adnexa and no mass, fullness, tenderness              Rectal exam: Yes.  .  Confirms.              Anus:  normal sphincter tone, no lesions  Chaperone was present for exam.  Assessment:   Well woman visit with normal exam. Osteopororsis.  Off Prolia due to improvement.  Followed by PCP. Simple left ovarian cyst. Normal CA125.  Urinary frequency.  Normal urine dip today.  Declines tx.  Plan: Yearly mammogram recommended after age 30.  Patient will schedule. Recommended self breast exam.  Pap and HR HPV as above. Discussed Calcium, Vitamin D, regular exercise program including cardiovascular and weight bearing exercise. Labs performed.  No..     Prescription medication(s) given.  No.   Return for pelvic ultrasound to check left ovarian cyst. Follow up annually and prn.       After visit summary provided.

## 2016-05-18 ENCOUNTER — Telehealth: Payer: Self-pay | Admitting: Obstetrics and Gynecology

## 2016-05-18 NOTE — Telephone Encounter (Signed)
Patient wants to reschedule her ultrasound appoinment. She is available on July 13th early morning and anytime on the 18th or 27th

## 2016-05-23 NOTE — Telephone Encounter (Signed)
Patient calling to reschedule her ultrasound appointment.

## 2016-05-23 NOTE — Telephone Encounter (Signed)
Call to patient and ultrasound is cancelled. Rescheduled for 06/16/16. Patient agreeable.  Routing to provider for final review. Patient agreeable to disposition. Will close encounter.

## 2016-05-26 ENCOUNTER — Other Ambulatory Visit: Payer: Medicare Other | Admitting: Obstetrics and Gynecology

## 2016-05-26 ENCOUNTER — Other Ambulatory Visit: Payer: Medicare Other

## 2016-06-16 ENCOUNTER — Encounter: Payer: Self-pay | Admitting: Obstetrics and Gynecology

## 2016-06-16 ENCOUNTER — Ambulatory Visit (INDEPENDENT_AMBULATORY_CARE_PROVIDER_SITE_OTHER): Payer: Medicare Other | Admitting: Obstetrics and Gynecology

## 2016-06-16 ENCOUNTER — Ambulatory Visit (INDEPENDENT_AMBULATORY_CARE_PROVIDER_SITE_OTHER): Payer: Medicare Other

## 2016-06-16 VITALS — BP 100/58 | HR 76 | Ht 63.75 in | Wt 122.0 lb

## 2016-06-16 DIAGNOSIS — N83202 Unspecified ovarian cyst, left side: Secondary | ICD-10-CM | POA: Diagnosis not present

## 2016-06-16 NOTE — Progress Notes (Signed)
GYNECOLOGY  VISIT   HPI: 65 y.o.   Married  Caucasian  female   [phone] with Patient's last menstrual period was 11/21/2001.   here for pelvic ultrasound for _____check of left ovarian cyst_____________.    Has 22 mm simple left ovarian cyst first noted on CT scan in 2014.  No ultrasound last year.  Normal CA125.  GYNECOLOGIC HISTORY: Patient's last menstrual period was 11/21/2001.          OB History    Gravida Para Term Preterm AB Living   SAB TAB Ectopic Multiple Live Births   3                 Patient Active Problem List   Diagnosis Date Noted  . Left ovarian cyst 08/01/2013  . Overactive bladder 08/01/2013    Past Medical History:  Diagnosis Date  . Anemia    h/o  . IBS (irritable bowel syndrome)    and colitis  . Kidney stones    h/o  . Migraines    h/o  . Osteoporosis    followed by PCP-on prolia    Past Surgical History:  Procedure Laterality Date  . BREAST BIOPSY     bilateral    Current Outpatient Prescriptions  Medication Sig Dispense Refill  . amitriptyline (ELAVIL) 25 MG tablet Take 25 mg by mouth at bedtime as needed for sleep.    Marland Kitchen aspirin 81 MG tablet Take 81 mg by mouth daily.    . Biotin 1 MG CAPS Take 1 tablet by mouth 3 (three) times daily.    Marland Kitchen CALCIUM PO Take by mouth 2 (two) times daily.    . cholecalciferol (VITAMIN D) 1000 UNITS tablet Take 1,000 Units by mouth daily.     No current facility-administered medications for this visit.      ALLERGIES: Codeine  Family History  Problem Relation Age of Onset  . Hypertension Mother   . Transient ischemic attack Mother   . Hypertension Father   . Cancer Maternal Grandfather     ? liver    Social History   Social History  . Marital status: Married    Spouse name: N/A  . Number of children: N/A  . Years of education: N/A   Occupational History  . Not on file.   Social History Main Topics  . Smoking status: Never Smoker  . Smokeless tobacco: Never Used  .  Alcohol use 1.2 - 1.8 oz/week    2 - 3 Standard drinks or equivalent per week     Comment: 4 glasses red wine per week  . Drug use: No  . Sexual activity: No   Other Topics Concern  . Not on file   Social History Narrative  . No narrative on file    ROS:  Pertinent items are noted in HPI.  PHYSICAL EXAMINATION:    BP (!) 100/58 (BP Location: Right Arm, Patient Position: Sitting, Cuff Size: Normal)   Pulse 76   Ht 5' 3.75" (1.619 m)   Wt 122 lb (55.3 kg)   LMP 11/21/2001   BMI 21.11 kg/m     General appearance: alert, cooperative and appears stated age   Ultrasound report and images reviewed with the patient.   ASSESSMENT  Stable simple left ovarian cyst.  No evidence of features to suggest malignancy.    PLAN  I did discuss signs and symptoms of malignancy for the patient to watch out  for and report back to me. Follow up every 2 -3 years with pelvic ultrasound.    An After Visit Summary was printed and given to the patient.  ___10___ minutes face to face time of which over 50% was spent in counseling.

## 2016-06-16 NOTE — Patient Instructions (Signed)
We can check your ovarian cyst every 2 - 3 years with pelvic ultrasound.

## 2016-06-16 NOTE — Progress Notes (Signed)
Patient ID: Chelsea Lowe, female   DOB: [birth year], 65 y.o.   MRN:

## 2016-07-18 ENCOUNTER — Other Ambulatory Visit: Payer: Self-pay | Admitting: Family Medicine

## 2016-07-18 DIAGNOSIS — Z1231 Encounter for screening mammogram for malignant neoplasm of breast: Secondary | ICD-10-CM

## 2016-07-28 ENCOUNTER — Ambulatory Visit
Admission: RE | Admit: 2016-07-28 | Discharge: 2016-07-28 | Disposition: A | Discharge status: Home / Self Care | Payer: Medicare Other | Source: Ambulatory Visit | Attending: Family Medicine | Admitting: Family Medicine

## 2016-07-28 DIAGNOSIS — Z1231 Encounter for screening mammogram for malignant neoplasm of breast: Secondary | ICD-10-CM

## 2016-12-08 ENCOUNTER — Other Ambulatory Visit: Payer: Self-pay | Admitting: Family Medicine

## 2016-12-08 DIAGNOSIS — I999 Unspecified disorder of circulatory system: Secondary | ICD-10-CM

## 2016-12-09 ENCOUNTER — Ambulatory Visit (HOSPITAL_COMMUNITY)
Admission: RE | Admit: 2016-12-09 | Discharge: 2016-12-09 | Disposition: A | Discharge status: Home / Self Care | Payer: Medicare Other | Source: Ambulatory Visit | Attending: Vascular Surgery | Admitting: Vascular Surgery

## 2016-12-09 DIAGNOSIS — G479 Sleep disorder, unspecified: Secondary | ICD-10-CM | POA: Diagnosis not present

## 2016-12-09 DIAGNOSIS — I999 Unspecified disorder of circulatory system: Secondary | ICD-10-CM | POA: Insufficient documentation

## 2017-05-12 ENCOUNTER — Ambulatory Visit: Payer: Medicare Other | Admitting: Obstetrics and Gynecology

## 2017-05-31 ENCOUNTER — Other Ambulatory Visit (HOSPITAL_COMMUNITY)
Admission: RE | Admit: 2017-05-31 | Discharge: 2017-05-31 | Disposition: A | Discharge status: Home / Self Care | Payer: Medicare Other | Source: Ambulatory Visit | Attending: Obstetrics and Gynecology | Admitting: Obstetrics and Gynecology

## 2017-05-31 ENCOUNTER — Ambulatory Visit (INDEPENDENT_AMBULATORY_CARE_PROVIDER_SITE_OTHER): Payer: Medicare Other | Admitting: Obstetrics and Gynecology

## 2017-05-31 ENCOUNTER — Encounter: Payer: Self-pay | Admitting: Obstetrics and Gynecology

## 2017-05-31 VITALS — BP 108/60 | HR 80 | Resp 16 | Ht 64.75 in | Wt 117.0 lb

## 2017-05-31 DIAGNOSIS — Z01419 Encounter for gynecological examination (general) (routine) without abnormal findings: Secondary | ICD-10-CM

## 2017-05-31 DIAGNOSIS — Z124 Encounter for screening for malignant neoplasm of cervix: Secondary | ICD-10-CM | POA: Diagnosis present

## 2017-05-31 DIAGNOSIS — R350 Frequency of micturition: Secondary | ICD-10-CM | POA: Diagnosis not present

## 2017-05-31 LAB — POCT URINALYSIS DIPSTICK
Bilirubin, UA: NEGATIVE
Glucose, UA: NEGATIVE
KETONES UA: NEGATIVE
Leukocytes, UA: NEGATIVE
Nitrite, UA: NEGATIVE
PROTEIN UA: NEGATIVE
RBC UA: NEGATIVE
UROBILINOGEN UA: 0.2 U/dL
pH, UA: 5 (ref 5.0–8.0)

## 2017-05-31 NOTE — Progress Notes (Signed)
66 y.o. [id number] Married Caucasian female here for annual exam.    Having frequent urination and urgency.   Seems to have increased during the day.  Night time urination.  Drinking seltzer water.   Some trouble sleeping.  Having some headaches in the am. Saw a homeopathic physician for evaluation of this as well.  Saw a sleep specialist.  She is seeing her PCP tomorrow.  She will ask for sleeping medication for her visit.   Has a known 21 mm thin walled left ovarian cyst last seen on US 05/2016.  Plan is for US every 2 - 3 years.   [place]   PCP:  Dr. Nedra [facility]Lee at [facility]  Patient's last menstrual period was 11/21/2001.           Sexually active: No.  The current method of family planning is post menopausal status.    Exercising: Yes.    walking Smoker:  no  Health Maintenance: Pap:  04-18-14 Neg History of abnormal Pap:  no MMG:  07/28/16 BIRADS 1 negative/density b Colonoscopy:  05-28-13 polyps with [facility];next due 05/2018 BMD:   2014  Result  Osteopenia--Hx of Osteoporosis and treatment TDaP:  Up to date with PCP Hep C: will discuss with PCP tomorrow at phsyical Screening Labs:  PCP takes care of labs   reports that she has never smoked. She has never used smokeless tobacco. She reports that she does not drink alcohol or use drugs.  Past Medical History:  Diagnosis Date  . Anemia    h/o  . IBS (irritable bowel syndrome)    and colitis  . Kidney stones    h/o  . Left ovarian cyst 2017   follow with ultrasound every 2 - 3 years.  . Migraines    h/o  . Osteoporosis    followed by PCP-on prolia    Past Surgical History:  Procedure Laterality Date  . BREAST BIOPSY     bilateral    Current Outpatient Prescriptions  Medication Sig Dispense Refill  . aspirin 81 MG tablet Take 81 mg by mouth daily.    . Biotin 1 MG CAPS Take 1 tablet by mouth 3 (three) times daily.    Marland Kitchen. CALCIUM PO Take by mouth 2 (two) times daily.     . cholecalciferol (VITAMIN D) 1000 UNITS tablet Take 1,000 Units by mouth daily.    . NON FORMULARY Place 2 patches onto the skin 3 days.    . NON FORMULARY 500 mg. CDB oil; 1 drop sublingual twice daily     No current facility-administered medications for this visit.     Family History  Problem Relation Age of Onset  . Hypertension Mother   . Transient ischemic attack Mother   . Hypertension Father   . Cancer Maternal Grandfather        ? liver    ROS:  Pertinent items are noted in HPI.  Otherwise, a comprehensive ROS was negative.  Exam:   BP 108/60 (BP Location: Right Arm, Patient Position: Sitting, Cuff Size: Normal)   Pulse 80   Resp 16   Ht 5' 4.75" (1.645 m)   Wt 117 lb (53.1 kg)   LMP 11/21/2001   BMI 19.62 kg/m     General appearance: alert, cooperative and appears stated age Head: Normocephalic, without obvious abnormality, atraumatic Neck: no adenopathy, supple, symmetrical, trachea midline and thyroid normal to inspection and palpation Lungs: clear to auscultation bilaterally Breasts: normal appearance,  no masses or tenderness, No nipple retraction or dimpling, No nipple discharge or bleeding, No axillary or supraclavicular adenopathy Heart: regular rate and rhythm Abdomen: soft, non-tender; no masses, no organomegaly Extremities: extremities normal, atraumatic, no cyanosis or edema Skin: Skin color, texture, turgor normal. No rashes or lesions Lymph nodes: Cervical, supraclavicular, and axillary nodes normal. No abnormal inguinal nodes palpated Neurologic: Grossly normal  Pelvic: External genitalia:  no lesions              Urethra:  normal appearing urethra with no masses, tenderness or lesions              Bartholins and Skenes: normal                 Vagina: normal appearing vagina with normal color and discharge, no lesions              Cervix: no lesions              Pap taken: Yes.   Bimanual Exam:  Uterus:  normal size, contour, position,  consistency, mobility, non-tender              Adnexa: no mass, fullness, tenderness              Rectal exam: Yes.  .  Confirms.              Anus:  normal sphincter tone, no lesions  Chaperone was present for exam.  Assessment:   Well woman visit with normal exam. Osteoporosis.  Status post Prolia.  Followed by PCP.  Sleep disorder. Simple left ovarian cyst.   Plan: Mammogram screening discussed. Recommended self breast awareness. Pap and HR HPV as above. Guidelines for Calcium, Vitamin D, regular exercise program including cardiovascular and weight bearing exercise. I suggested she see neurology for sleep evaluation.  She will discuss with her PCP. Pelvic US in 2019 or 2020.  Follow up annually and prn.   After visit summary provided.

## 2017-05-31 NOTE — Patient Instructions (Signed)

## 2017-06-01 LAB — CYTOLOGY - PAP: DIAGNOSIS: NEGATIVE

## 2017-09-15 ENCOUNTER — Other Ambulatory Visit: Payer: Self-pay | Admitting: Obstetrics and Gynecology

## 2017-09-15 DIAGNOSIS — Z1231 Encounter for screening mammogram for malignant neoplasm of breast: Secondary | ICD-10-CM

## 2017-11-01 ENCOUNTER — Ambulatory Visit
Admission: RE | Admit: 2017-11-01 | Discharge: 2017-11-01 | Disposition: A | Discharge status: Home / Self Care | Payer: Medicare Other | Source: Ambulatory Visit | Attending: Obstetrics and Gynecology | Admitting: Obstetrics and Gynecology

## 2017-11-01 DIAGNOSIS — Z1231 Encounter for screening mammogram for malignant neoplasm of breast: Secondary | ICD-10-CM

## 2018-04-23 ENCOUNTER — Other Ambulatory Visit: Payer: Self-pay | Admitting: Family Medicine

## 2018-04-24 ENCOUNTER — Other Ambulatory Visit: Payer: Self-pay | Admitting: Family Medicine

## 2018-04-24 DIAGNOSIS — R42 Dizziness and giddiness: Secondary | ICD-10-CM

## 2018-05-04 ENCOUNTER — Other Ambulatory Visit: Payer: Medicare Other

## 2018-05-08 ENCOUNTER — Inpatient Hospital Stay
Admission: RE | Admit: 2018-05-08 | Discharge: 2018-05-08 | Disposition: A | Discharge status: Home / Self Care | Payer: Medicare Other | Source: Ambulatory Visit | Attending: Family Medicine | Admitting: Family Medicine

## 2018-05-22 ENCOUNTER — Ambulatory Visit
Admission: RE | Admit: 2018-05-22 | Discharge: 2018-05-22 | Disposition: A | Discharge status: Home / Self Care | Payer: Medicare Other | Source: Ambulatory Visit | Attending: Family Medicine | Admitting: Family Medicine

## 2018-05-22 DIAGNOSIS — R42 Dizziness and giddiness: Secondary | ICD-10-CM

## 2018-05-22 MED ORDER — GADOBENATE DIMEGLUMINE 529 MG/ML IV SOLN
10.0000 mL | Freq: Once | INTRAVENOUS | Status: AC | PRN
  Administered 2018-05-22: 10 mL via INTRAVENOUS

## 2018-06-01 ENCOUNTER — Ambulatory Visit (INDEPENDENT_AMBULATORY_CARE_PROVIDER_SITE_OTHER): Payer: Medicare Other | Admitting: Obstetrics and Gynecology

## 2018-06-01 ENCOUNTER — Encounter: Payer: Self-pay | Admitting: Obstetrics and Gynecology

## 2018-06-01 VITALS — BP 106/74 | HR 64 | Temp 97.6°F | Resp 16 | Ht 64.0 in | Wt 112.0 lb

## 2018-06-01 DIAGNOSIS — N83202 Unspecified ovarian cyst, left side: Secondary | ICD-10-CM

## 2018-06-01 DIAGNOSIS — Z1211 Encounter for screening for malignant neoplasm of colon: Secondary | ICD-10-CM

## 2018-06-01 DIAGNOSIS — Z01419 Encounter for gynecological examination (general) (routine) without abnormal findings: Secondary | ICD-10-CM | POA: Diagnosis not present

## 2018-06-01 DIAGNOSIS — R350 Frequency of micturition: Secondary | ICD-10-CM | POA: Diagnosis not present

## 2018-06-01 LAB — POCT URINALYSIS DIPSTICK
Bilirubin, UA: NEGATIVE
Blood, UA: NEGATIVE
GLUCOSE UA: NEGATIVE
KETONES UA: NEGATIVE
Leukocytes, UA: NEGATIVE
Nitrite, UA: NEGATIVE
Protein, UA: NEGATIVE
SPEC GRAV UA: 1.015 (ref 1.010–1.025)
Urobilinogen, UA: 0.2 E.U./dL
pH, UA: 5.5 (ref 5.0–8.0)

## 2018-06-01 NOTE — Patient Instructions (Addendum)
EXERCISE AND DIET:  We recommended that you start or continue a regular exercise program for good health. Regular exercise means any activity that makes your heart beat faster and makes you sweat.  We recommend exercising at least 30 minutes per day at least 3 days a week, preferably 4 or 5.  We also recommend a diet low in fat and sugar.  Inactivity, poor dietary choices and obesity can cause diabetes, heart attack, stroke, and kidney damage, among others.    ALCOHOL AND SMOKING:  Women should limit their alcohol intake to no more than 7 drinks/beers/glasses of wine (combined, not each!) per week. Moderation of alcohol intake to this level decreases your risk of breast cancer and liver damage. And of course, no recreational drugs are part of a healthy lifestyle.  And absolutely no smoking or even second hand smoke. Most people know smoking can cause heart and lung diseases, but did you know it also contributes to weakening of your bones? Aging of your skin?  Yellowing of your teeth and nails?  CALCIUM AND VITAMIN D:  Adequate intake of calcium and Vitamin D are recommended.  The recommendations for exact amounts of these supplements seem to change often, but generally speaking 600 mg of calcium (either carbonate or citrate) and 800 units of Vitamin D per day seems prudent. Certain women may benefit from higher intake of Vitamin D.  If you are among these women, your doctor will have told you during your visit.    PAP SMEARS:  Pap smears, to check for cervical cancer or precancers,  have traditionally been done yearly, although recent scientific advances have shown that most women can have pap smears less often.  However, every woman still should have a physical exam from her gynecologist every year. It will include a breast check, inspection of the vulva and vagina to check for abnormal growths or skin changes, a visual exam of the cervix, and then an exam to evaluate the size and shape of the uterus and  ovaries.  And after 69 years of age, a rectal exam is indicated to check for rectal cancers. We will also provide age appropriate advice regarding health maintenance, like when you should have certain vaccines, screening for sexually transmitted diseases, bone density testing, colonoscopy, mammograms, etc.   MAMMOGRAMS:  All women over 40 years old should have a yearly mammogram. Many facilities now offer a "3D" mammogram, which may cost around $50 extra out of pocket. If possible,  we recommend you accept the option to have the 3D mammogram performed.  It both reduces the number of women who will be called back for extra views which then turn out to be normal, and it is better than the routine mammogram at detecting truly abnormal areas.    COLONOSCOPY:  Colonoscopy to screen for colon cancer is recommended for all women at age 50.  We know, you hate the idea of the prep.  We agree, BUT, having colon cancer and not knowing it is worse!!  Colon cancer so often starts as a polyp that can be seen and removed at colonscopy, which can quite literally save your life!  And if your first colonoscopy is normal and you have no family history of colon cancer, most women don't have to have it again for 10 years.  Once every ten years, you can do something that may end up saving your life, right?  We will be happy to help you get it scheduled when you are ready.    Be sure to check your insurance coverage so you understand how much it will cost.  It may be covered as a preventative service at no cost, but you should check your particular policy.      Overactive Bladder, Adult Overactive bladder is a group of urinary symptoms. With overactive bladder, you may suddenly feel the need to pass urine (urinate) right away. After feeling this sudden urge, you might also leak urine if you cannot get to the bathroom fast enough (urinary incontinence). These symptoms might interfere with your daily work or social activities.  Overactive bladder symptoms may also wake you up at night. Overactive bladder affects the nerve signals between your bladder and your brain. Your bladder may get the signal to empty before it is full. Very sensitive muscles can also make your bladder squeeze too soon. What are the causes? Many things can cause an overactive bladder. Possible causes include:  Urinary tract infection.  Infection of nearby tissues, such as the prostate.  Prostate enlargement.  Being pregnant with twins or more (multiples).  Surgery on the uterus or urethra.  Bladder stones, inflammation, or tumors.  Drinking too much caffeine or alcohol.  Certain medicines, especially those that you take to help your body get rid of extra fluid (diuretics) by increasing urine production.  Muscle or nerve weakness, especially from: ? A spinal cord injury. ? Stroke. ? Multiple sclerosis. ? Parkinson disease.  Diabetes. This can cause a high urine volume that fills the bladder so quickly that the normal urge to urinate is triggered very strongly.  Constipation. A buildup of too much stool can put pressure on your bladder.  What increases the risk? You may be at greater risk for overactive bladder if you:  Are an older adult.  Smoke.  Are going through menopause.  Have prostate problems.  Have a neurological disease, such as stroke, dementia, Parkinson disease, or multiple sclerosis (MS).  Eat or drink things that irritate the bladder. These include alcohol, spicy food, and caffeine.  Are overweight or obese.  What are the signs or symptoms? The signs and symptoms of an overactive bladder include:  Sudden, strong urges to urinate.  Leaking urine.  Urinating eight or more times per day.  Waking up to urinate two or more times per night.  How is this diagnosed? Your health care provider may suspect overactive bladder based on your symptoms. The health care provider will do a physical exam and take  your medical history. Blood or urine tests may also be done. For example, you might need to have a bladder function test to check how well you can hold your urine. You might also need to see a health care provider who specializes in the urinary tract (urologist). How is this treated? Treatment for overactive bladder depends on the cause of your condition and whether it is mild or severe. Certain treatments can be done in your health care provider's office or clinic. You can also make lifestyle changes at home. Options include: Behavioral Treatments  Biofeedback. A specialist uses sensors to help you become aware of your body's signals.  Keeping a daily log of when you need to urinate and what happens after the urge. This may help you manage your condition.  Bladder training. This helps you learn to control the urge to urinate by following a schedule that directs you to urinate at regular intervals (timed voiding). At first, you might have to wait a few minutes after feeling the urge. In time,   you should be able to schedule bathroom visits an hour or more apart.  Kegel exercises. These are exercises to strengthen the pelvic floor muscles, which support the bladder. Toning these muscles can help you control urination, even if your bladder muscles are overactive. A specialist will teach you how to do these exercises correctly. They require daily practice.  Weight loss. If you are obese or overweight, losing weight might relieve your symptoms of overactive bladder. Talk to your health care provider about losing weight and whether there is a specific program or method that would work best for you.  Diet change. This might help if constipation is making your overactive bladder worse. Your health care provider or a dietitian can explain ways to change what you eat to ease constipation. You might also need to consume less alcohol and caffeine or drink other fluids at different times of the day.  Stopping  smoking.  Wearing pads to absorb leakage while you wait for other treatments to take effect. Physical Treatments  Electrical stimulation. Electrodes send gentle pulses of electricity to strengthen the nerves or muscles that help to control the bladder. Sometimes, the electrodes are placed outside of the body. In other cases, they might be placed inside the body (implanted). This treatment can take several months to have an effect.  Supportive devices. Women may need a plastic device that fits into the vagina and supports the bladder (pessary). Medicines Several medicines can help treat overactive bladder and are usually used along with other treatments. Some are injected into the muscles involved in urination. Others come in pill form. Your health care provider may prescribe:  Antispasmodics. These medicines block the signals that the nerves send to the bladder. This keeps the bladder from releasing urine at the wrong time.  Tricyclic antidepressants. These types of antidepressants also relax bladder muscles.  Surgery  You may have a device implanted to help manage the nerve signals that indicate when you need to urinate.  You may have surgery to implant electrodes for electrical stimulation.  Sometimes, very severe cases of overactive bladder require surgery to change the shape of the bladder. Follow these instructions at home:  Take medicines only as directed by your health care provider.  Use any implants or a pessary as directed by your health care provider.  Make any diet or lifestyle changes that are recommended by your health care provider. These might include: ? Drinking less fluid or drinking at different times of the day. If you need to urinate often during the night, you may need to stop drinking fluids early in the evening. ? Cutting down on caffeine or alcohol. Both can make an overactive bladder worse. Caffeine is found in coffee, tea, and sodas. ? Doing Kegel exercises  to strengthen muscles. ? Losing weight if you need to. ? Eating a healthy and balanced diet to prevent constipation.  Keep a journal or log to track how much and when you drink and also when you feel the need to urinate. This will help your health care provider to monitor your condition. Contact a health care provider if:  Your symptoms do not get better after treatment.  Your pain and discomfort are getting worse.  You have more frequent urges to urinate.  You have a fever. Get help right away if: You are not able to control your bladder at all. This information is not intended to replace advice given to you by your health care provider. Make sure you discuss any questions   you have with your health care provider. Document Released: 09/03/2009 Document Revised: 04/14/2016 Document Reviewed: 04/02/2014 Elsevier Interactive Patient Education  2018 [employer] Inc[employer]  

## 2018-06-01 NOTE — Progress Notes (Signed)
67 y.o. [id number] Married Caucasian female here for annual exam.    Reporting anxiety.  Taking Trazodone.  Waking up every 2 hours. This is chronic.  Has urinary frequency and night time urination.  Voids every time she wakes up.   Daughter married.  Putting their house on the market.  [place] [facility] [place]   PCP: [facility] Ned[facility]Lee at [facility]     Urine dip - negative.   Patient's last menstrual period was 11/21/2001.           Sexually active: No.  The current method of family planning is post menopausal status.    Exercising: Yes.   walking Smoker:  no  Health Maintenance: Pap:  05/31/17 Pap smear Negative History of abnormal Pap:  no MMG:  11/01/17 BIRADS 1 negative/density b Colonoscopy:  05-28-13 polyps with [facility];next due 05/2018.   BMD:   2019  Result  Osteoporosis -- May start Prolia.  Took in the past.  TDaP:  Up to date with PCP Gardasil:   n/a HIV:  Will do with PCP. Hep C:  Will do with PCP.  Screening Labs:  PCP   reports that she has never smoked. She has never used smokeless tobacco. She reports that she does not drink alcohol or use drugs.  Past Medical History:  Diagnosis Date  . Anemia    h/o  . IBS (irritable bowel syndrome)    and colitis  . Kidney stones    h/o  . Left ovarian cyst 2017   follow with ultrasound every 2 - 3 years.  . Migraines    h/o  . Osteoporosis    followed by PCP- status post prolia    Past Surgical History:  Procedure Laterality Date  . BREAST BIOPSY     bilateral  . BREAST EXCISIONAL BIOPSY Bilateral     Current Outpatient Medications  Medication Sig Dispense Refill  . aspirin 81 MG tablet Take 81 mg by mouth daily.    . Biotin 1 MG CAPS Take 1 tablet by mouth 3 (three) times daily.    Marland Kitchen. CALCIUM PO Take by mouth 2 (two) times daily.    . cholecalciferol (VITAMIN D) 1000 UNITS tablet Take 1,000 Units by mouth daily.    . NON FORMULARY Place 2 patches onto the skin 3 days.    . NON  FORMULARY 500 mg. CDB oil; 1 drop sublingual twice daily     No current facility-administered medications for this visit.     Family History  Problem Relation Age of Onset  . Hypertension Mother   . Transient ischemic attack Mother   . Hypertension Father   . Cancer Maternal Grandfather        ? liver    Review of Systems  Constitutional: Negative.        Weight loss  HENT: Negative.   Eyes: Negative.   Respiratory: Negative.   Cardiovascular: Negative.   Gastrointestinal: Positive for constipation and diarrhea.  Endocrine: Negative.   Genitourinary: Negative.        Night urination  Musculoskeletal: Negative.   Skin: Positive for color change.  Allergic/Immunologic: Negative.   Neurological: Positive for headaches.  Hematological: Negative.   Psychiatric/Behavioral: Negative.     Exam:   LMP 11/21/2001     General appearance: alert, cooperative and appears stated age Head: Normocephalic, without obvious abnormality, atraumatic Neck: no adenopathy, supple, symmetrical, trachea midline and thyroid normal to inspection and palpation Lungs: clear  to auscultation bilaterally Breasts: normal appearance, no masses or tenderness, No nipple retraction or dimpling, No nipple discharge or bleeding, No axillary or supraclavicular adenopathy Heart: regular rate and rhythm Abdomen: soft, non-tender; no masses, no organomegaly Extremities: extremities normal, atraumatic, no cyanosis or edema Skin: Skin color, texture, turgor normal. No rashes or lesions Lymph nodes: Cervical, supraclavicular, and axillary nodes normal. No abnormal inguinal nodes palpated Neurologic: Grossly normal  Pelvic: External genitalia:  no lesions              Urethra:  normal appearing urethra with no masses, tenderness or lesions              Bartholins and Skenes: normal                 Vagina: normal appearing vagina with normal color and discharge, no lesions              Cervix: no lesions               Pap taken: Yes.   Bimanual Exam:  Uterus:  normal size, contour, position, consistency, mobility, non-tender              Adnexa: no mass, fullness, tenderness              Rectal exam: Yes.  .  Confirms.              Anus:  normal sphincter tone, no lesions  Chaperone was present for exam.  Assessment:   Well woman visit with normal exam. Osteoporosis.  Status post Prolia.  Followed by PCP.  Sleep disorder. Simple left ovarian cyst. Urinary frequency.  Declines medication.   Plan: Mammogram screening. Recommended self breast awareness. Pap and HR HPV as above. Guidelines for Calcium, Vitamin D, regular exercise program including cardiovascular and weight bearing exercise. Return for pelvic US. Discussed overactive bladder and written information.  Will try to get BMD report.  Follow up annually and prn.   After visit summary provided.

## 2018-06-04 ENCOUNTER — Telehealth: Payer: Self-pay | Admitting: Obstetrics and Gynecology

## 2018-06-04 ENCOUNTER — Encounter: Payer: Self-pay | Admitting: Internal Medicine

## 2018-06-04 NOTE — Telephone Encounter (Signed)
Patient returned call. Spoke with patient regarding benefit for recommended ultrasound. Patient understood and agreeable. Patient ready to schedule. Patient scheduled 06/07/18 with Dr Edward JollySilva. Patient aware of appointment date, arrival time and   cancellation policy. No further questions. Ok to close

## 2018-06-04 NOTE — Telephone Encounter (Signed)
Call placed to patient on designated primary contact phone number, to review benefits for recommended ultrasound. Left voicemail message requesting a return call.

## 2018-06-07 ENCOUNTER — Ambulatory Visit (INDEPENDENT_AMBULATORY_CARE_PROVIDER_SITE_OTHER): Payer: Medicare Other | Admitting: Obstetrics and Gynecology

## 2018-06-07 ENCOUNTER — Other Ambulatory Visit: Payer: Self-pay

## 2018-06-07 ENCOUNTER — Ambulatory Visit (INDEPENDENT_AMBULATORY_CARE_PROVIDER_SITE_OTHER): Payer: Medicare Other

## 2018-06-07 ENCOUNTER — Encounter: Payer: Self-pay | Admitting: Obstetrics and Gynecology

## 2018-06-07 ENCOUNTER — Other Ambulatory Visit: Payer: Self-pay | Admitting: Obstetrics and Gynecology

## 2018-06-07 VITALS — BP 108/56 | HR 78 | Resp 14 | Ht 64.0 in | Wt 111.4 lb

## 2018-06-07 DIAGNOSIS — N83202 Unspecified ovarian cyst, left side: Secondary | ICD-10-CM

## 2018-06-07 DIAGNOSIS — Z1211 Encounter for screening for malignant neoplasm of colon: Secondary | ICD-10-CM | POA: Diagnosis not present

## 2018-06-07 NOTE — Progress Notes (Signed)
GYNECOLOGY  VISIT   HPI: 67 y.o.   Married  Caucasian  female   [phone] with Patient's last menstrual period was 11/21/2001.   here for   Pelvic ultrasound to follow up left ovarian cyst.   Originally left ovarian cyst seen on CT scan in 2014.  Measured 22 mm and simple appearance.   Last pelvic US on 06/16/16 -  Simple left ovarian thin walled cyst 21 mm, no abnormal blood flow and no change since 2015 US.   Had constipation and a tiny amount of blood in her stool from straining.  Then developed diarrhea.   Has GI consultation 08/16/18.  GYNECOLOGIC HISTORY: Patient's last menstrual period was 11/21/2001. Contraception:  Post menopausal Menopausal hormone therapy:  none Last mammogram:  11-01-17 density B/BIRADS 1 negative Last pap smear:   05-31-17 negative         OB History    Gravida  4   Para  1   Term      Preterm      AB  3   Living  1     SAB  3   TAB      Ectopic      Multiple      Live Births                 Patient Active Problem List   Diagnosis Date Noted  . Left ovarian cyst 08/01/2013  . Overactive bladder 08/01/2013    Past Medical History:  Diagnosis Date  . Anemia    h/o  . IBS (irritable bowel syndrome)    and colitis  . Kidney stones    h/o  . Left ovarian cyst 2017   follow with ultrasound every 2 - 3 years.  . Migraines    h/o  . Osteoporosis    followed by PCP- status post prolia    Past Surgical History:  Procedure Laterality Date  . BREAST BIOPSY     bilateral  . BREAST EXCISIONAL BIOPSY Bilateral     Current Outpatient Medications  Medication Sig Dispense Refill  . Biotin 1 MG CAPS Take 1 tablet by mouth 3 (three) times daily.    Marland Kitchen. CALCIUM PO Take by mouth 2 (two) times daily.    . cholecalciferol (VITAMIN D) 1000 UNITS tablet Take 1,000 Units by mouth daily.     No current facility-administered medications for this visit.      ALLERGIES: Codeine  Family History  Problem Relation Age of  Onset  . Hypertension Mother   . Transient ischemic attack Mother   . Hypertension Father   . Cancer Maternal Grandfather        ? liver    Social History   Socioeconomic History  . Marital status: Married    Spouse name: Not on file  . Number of children: Not on file  . Years of education: Not on file  . Highest education level: Not on file  Occupational History  . Not on file  Social Needs  . Financial resource strain: Not on file  . Food insecurity:    Worry: Not on file    Inability: Not on file  . Transportation needs:    Medical: Not on file    Non-medical: Not on file  Tobacco Use  . Smoking status: Never Smoker  . Smokeless tobacco: Never Used  Substance and Sexual Activity  . Alcohol use: No    Alcohol/week: 1.2 - 1.8 oz  Types: 2 - 3 Standard drinks or equivalent per week  . Drug use: No  . Sexual activity: Never    Partners: Male    Birth control/protection: Post-menopausal  Lifestyle  . Physical activity:    Days per week: Not on file    Minutes per session: Not on file  . Stress: Not on file  Relationships  . Social connections:    Talks on phone: Not on file    Gets together: Not on file    Attends religious service: Not on file    Active member of club or organization: Not on file    Attends meetings of clubs or organizations: Not on file    Relationship status: Not on file  . Intimate partner violence:    Fear of current or ex partner: Not on file    Emotionally abused: Not on file    Physically abused: Not on file    Forced sexual activity: Not on file  Other Topics Concern  . Not on file  Social History Narrative  . Not on file    Review of Systems  Constitutional: Negative.   HENT: Negative.   Eyes: Negative.   Respiratory: Negative.   Cardiovascular: Negative.   Gastrointestinal: Positive for blood in stool and diarrhea.  Endocrine: Negative.   Genitourinary: Positive for frequency.       Night urination    Musculoskeletal:  Negative.   Skin: Negative.   Allergic/Immunologic: Negative.   Neurological: Negative.   Hematological: Negative.   Psychiatric/Behavioral: Negative.     PHYSICAL EXAMINATION:    BP (!) 108/56 (BP Location: Right Arm, Patient Position: Sitting, Cuff Size: Normal)   Pulse 78   Resp 14   Ht 5\' 4"  (1.626 m)   Wt 111 lb 6.4 oz (50.5 kg)   LMP 11/21/2001   BMI 19.12 kg/m     General appearance: alert, cooperative and appears stated age   Pelvic US Uterus not masses.  EMS 2.25 mm. Left ovary 22 mm simple ovarian cyst, no abnormal blood flow.  No change in size.  Right ovary normal.  No adnexal mass.  No free fluid.   ASSESSMENT  Simple left ovarian cyst stable for 5 years.  Constipation and straining with blood in stool. Hx constipation and diarrhea.   PLAN  We discussed her benign and stable appearing left ovarian cyst.  No surgical intervention planned at this time.  No scheduled ultrasound follow up.  Return for symptoms of pain, vaginal bleeding, abdominal distention, or any other concern.  IFOB for patient to do next week.  If blood confirmed, will help her to move up her GI appointment.  FU prn and for annual exam appointment.    An After Visit Summary was printed and given to the patient.  __15____ minutes face to face time of which over 50% was spent in counseling.

## 2018-08-08 ENCOUNTER — Encounter: Payer: Self-pay | Admitting: Neurology

## 2018-08-08 ENCOUNTER — Ambulatory Visit: Payer: Medicare Other | Admitting: Neurology

## 2018-08-08 VITALS — BP 91/51 | HR 77 | Ht 64.0 in | Wt 113.0 lb

## 2018-08-08 DIAGNOSIS — G43709 Chronic migraine without aura, not intractable, without status migrainosus: Secondary | ICD-10-CM

## 2018-08-08 MED ORDER — AMITRIPTYLINE HCL 25 MG PO TABS: 25.0000 mg | ORAL_TABLET | Freq: Every day | ORAL | 11 refills | Status: AC

## 2018-08-08 MED ORDER — ERENUMAB-AOOE 140 MG/ML ~~LOC~~ SOAJ: 140.0000 mg | SUBCUTANEOUS | 11 refills | Status: AC

## 2018-08-08 MED ORDER — AMITRIPTYLINE HCL 25 MG PO TABS: 25.0000 mg | ORAL_TABLET | Freq: Every day | ORAL | 3 refills | Status: DC

## 2018-08-08 NOTE — Progress Notes (Signed)
[facility]    Provider:  Dr Lucia Gaskins Referring Provider: Lenell Antu, DO Primary Care Physician:  Lenell Antu, DO  CC:  Migraines  HPI:  Chelsea Lowe is a 67 y.o. female here as requested by Dr. Conley Rolls for migraines. Here with husband who also provides information. PMHx chronic anemia, tension headache, migraine, anxiety, goiter, hld. She has 3-4 headache days a week. She wakes with headaches in the morning. She has a history migraines since her 43s. For many years she has had intractable headaches, almost every day now. At least 10 years. Her whole head aches mostly on the top of her head, can be severe. She will occasionally take something. No medication overuse. She gets nausea, +light sensitivity, +sound sensitivity, + dizziness. They can last 4 hours - 72 hours. Laying down helps. If severe can be pulsating/throbbing, it just hurts severely. Lack of sleep is a trigger. 15 migraine days a month.  Mother and grandmother had migraines. No snoring. No other signs of OSA or sleep issues. She wakes frequently. She has difficulty falling asleep. Her mind is racing and she has a lot of stress. No other focal neurologic deficits, associated symptoms, inciting events or modifiable factors.  Preventatives tried: amitriptyline, trazodone, (propranolol, topiramate in the past)  Reviewed notes, labs and imaging from outside physicians, which showed:  Reviewed Dr. Marigene Ehlers notes referring physician.  She has a history of long-standing diffuse migraine headaches, MRI shows no acute changes but there is 2 areas of chronic microhemorrhage which is stable.  Personally reviewed MRI images and agree wit the following:  1. No acute intracranial abnormality and largely normal for age MRI appearance of the brain. 2. Two tiny chronic micro-hemorrhages in the right cerebellum and occipital lobe are nonspecific.     Review of Systems: Patient complains of symptoms per HPI as well as the  following symptoms: insomnia, memory loss, headache, anxiety. Pertinent negatives and positives per HPI. All others negative.   Social History   Socioeconomic History  . Marital status: Married    Spouse name: Not on file  . Number of children: 1  . Years of education: Not on file  . Highest education level: Bachelor's degree (e.g., BA, AB, BS)  Occupational History  . Not on file  Social Needs  . Financial resource strain: Not on file  . Food insecurity:    Worry: Not on file    Inability: Not on file  . Transportation needs:    Medical: Not on file    Non-medical: Not on file  Tobacco Use  . Smoking status: Former Smoker    Years: 10.00    Types: Cigarettes    Start date: 1974    Last attempt to quit: 1984    Years since quitting: 35.7  . Smokeless tobacco: Never Used  . Tobacco comment: former light smoker  Substance and Sexual Activity  . Alcohol use: Yes    Alcohol/week: 2.0 - 3.0 standard drinks    Types: 2 - 3 Standard drinks or equivalent per week  . Drug use: Never  . Sexual activity: Never    Partners: Male    Birth control/protection: Post-menopausal  Lifestyle  . Physical activity:    Days per week: Not on file    Minutes per session: Not on file  . Stress: Not on file  Relationships  . Social connections:    Talks on phone: Not on file    Gets together: Not on file  Attends religious service: Not on file    Active member of club or organization: Not on file    Attends meetings of clubs or organizations: Not on file    Relationship status: Not on file  . Intimate partner violence:    Fear of current or ex partner: Not on file    Emotionally abused: Not on file    Physically abused: Not on file    Forced sexual activity: Not on file  Other Topics Concern  . Not on file  Social History Narrative   Lives at home with her husband   Retired   Right handed   Caffeine: 2 cups of tea a day at the most and a piece of chocolate each day    Family  History  Problem Relation Age of Onset  . Hypertension Mother   . Transient ischemic attack Mother   . Migraines Mother   . Hypertension Father   . Emphysema Father   . Heart disease Father   . Cancer Maternal Grandfather        ? liver  . Migraines Maternal Grandmother     Past Medical History:  Diagnosis Date  . Anemia    h/o  . Anxiety   . Bronchitis   . Chronic insomnia   . IBS (irritable bowel syndrome)    and colitis  . Kidney stones    h/o  . Left ovarian cyst 2017   follow with ultrasound every 2 - 3 years.  . Migraines    h/o  . Muscle contraction headache   . Nontoxic single thyroid nodule   . Osteoporosis    followed by PCP- status post prolia  . Pure hypercholesterolemia   . Sleep disorder, unspecified   . Tension headache, chronic     Past Surgical History:  Procedure Laterality Date  . BREAST BIOPSY     bilateral  . BREAST EXCISIONAL BIOPSY Bilateral   . EYE SURGERY     laser for narrow angle glaucoma    Current Outpatient Medications  Medication Sig Dispense Refill  . b complex vitamins tablet Take 1 tablet by mouth every other day.    . Biotin 1 MG CAPS Take 1 tablet by mouth every other day.     Marland Kitchen. CALCIUM PO Take 1 tablet by mouth every other day.     . cholecalciferol (VITAMIN D) 1000 UNITS tablet Take 1,000 Units by mouth every other day.     . Omega-3 Fatty Acids (FISH OIL) 1000 MG CAPS Take 1 capsule by mouth every other day.    . traZODone (DESYREL) 50 MG tablet Take 50 mg by mouth at bedtime.    Marland Kitchen. amitriptyline (ELAVIL) 25 MG tablet Take 1 tablet (25 mg total) by mouth at bedtime. 30 tablet 11  . Erenumab-aooe (AIMOVIG) 140 MG/ML SOAJ Inject 140 mg into the skin every 30 (thirty) days. 1 pen 11   No current facility-administered medications for this visit.     Allergies as of 08/08/2018 - Review Complete 08/08/2018  Allergen Reaction Noted  . Codeine  05/28/2013    Vitals: BP (!) 91/51 (BP Location: Right Arm, Patient Position:  Sitting) Comment: took again with small cuff; pt states she feels fine  Pulse 77   Ht 5\' 4"  (1.626 m)   Wt 113 lb (51.3 kg)   LMP 11/21/2001   BMI 19.40 kg/m  Last Weight:  Wt Readings from Last 1 Encounters:  08/08/18 113 lb (51.3 kg)   Last Height:  Ht Readings from Last 1 Encounters:  08/08/18 5\' 4"  (1.626 m)   Physical exam: Exam: Gen: NAD, conversant, well nourised, well groomed                     CV: RRR, no MRG. No Carotid Bruits. No peripheral edema, warm, nontender Eyes: Conjunctivae clear without exudates or hemorrhage  Neuro: Detailed Neurologic Exam  Speech:    Speech is normal; fluent and spontaneous with normal comprehension.  Cognition:    The patient is oriented to person, place, and time;     recent and remote memory intact;     language fluent;     normal attention, concentration,     fund of knowledge Cranial Nerves:    The pupils are equal, round, and reactive to light. Attempted fundoscopy could not visualize. Visual fields are full to finger confrontation. Extraocular movements are intact. Trigeminal sensation is intact and the muscles of mastication are normal. The face is symmetric. The palate elevates in the midline. Hearing intact. Voice is normal. Shoulder shrug is normal. The tongue has normal motion without fasciculations.   Coordination:    Normal finger to nose and heel to shin. Normal rapid alternating movements.   Gait:    Heel-toe and tandem gait are normal.   Motor Observation:    No asymmetry, no atrophy, and no involuntary movements noted. Tone:    Normal muscle tone.    Posture:    Posture is normal. normal erect    Strength:    Strength is V/V in the upper and lower limbs.      Sensation: intact to LT     Reflex Exam:  DTR's:    Deep tendon reflexes in the upper and lower extremities are normal bilaterally.   Toes:    The toes are downgoing bilaterally.   Clonus:    Clonus is absent.       Assessment/Plan:   Patient with chronic intractable migraines, no aura, no medication overuse  - MRI with 2 micro-hemorrhages:  Cerebral microhemorrhages have been noted in healthy elderly, Microhemorrhages have been associated with older age, hypertension, smoking, white matter disease, lacunar infarcts, previous ischemic stroke, or ICH. In [facility], discussed in detail. Repeat MRI in one year  - Discussed options, she would like to start Aimovig injections and Amitriptyline at night to help with migraines/headaches and sleep  Meds ordered this encounter  Medications  . DISCONTD: amitriptyline (ELAVIL) 25 MG tablet    Sig: Take 1 tablet (25 mg total) by mouth at bedtime.    Dispense:  30 tablet    Refill:  3  . Erenumab-aooe (AIMOVIG) 140 MG/ML SOAJ    Sig: Inject 140 mg into the skin every 30 (thirty) days.    Dispense:  1 pen    Refill:  11  . amitriptyline (ELAVIL) 25 MG tablet    Sig: Take 1 tablet (25 mg total) by mouth at bedtime.    Dispense:  30 tablet    Refill:  11   Discussed: To prevent or relieve headaches, try the following: Cool Compress. Lie down and place a cool compress on your head.  Avoid headache triggers. If certain foods or odors seem to have triggered your migraines in the past, avoid them. A headache diary might help you identify triggers.  Include physical activity in your daily routine. Try a daily walk or other moderate aerobic exercise.  Manage stress. Find healthy ways to cope with the stressors, such as  delegating tasks on your to-do list.  Practice relaxation techniques. Try deep breathing, yoga, massage and visualization.  Eat regularly. Eating regularly scheduled meals and maintaining a healthy diet might help prevent headaches. Also, drink plenty of fluids.  Follow a regular sleep schedule. Sleep deprivation might contribute to headaches Consider biofeedback. With this mind-body technique, you learn to control certain bodily functions - such as muscle tension, heart rate and  blood pressure - to prevent headaches or reduce headache pain.    Proceed to emergency room if you experience new or worsening symptoms or symptoms do not resolve, if you have new neurologic symptoms or if headache is severe, or for any concerning symptom.   Provided education and documentation from [employer] toolbox including articles on: chronic migraine medication overuse headache, chronic migraines, prevention of migraines, behavioral and other nonpharmacologic treatments for headache.    Naomie Dean, MD  [facility] [address] [place], [place] [address]  Phone [phone] Fax [phone]

## 2018-08-08 NOTE — Patient Instructions (Addendum)
Start Amitriptyline at night  Start Erenumab (Aimovig)  Erenumab: Drug information [employer] Online here. Copyright [phone] [employer] All rights reserved. (For additional information see "Erenumab: Patient drug information")  For abbreviations and symbols that may be used in [employer] (show table) Brand Names: [place]  Aimovig;  Aimovig (140 MG Dose)  Brand Names: [place]  Aimovig  Pharmacologic Category  Calcitonin Gene-Related Peptide (CGRP) Receptor Antagonist;  Monoclonal Antibody, CGRP Antagonist  Dosing: Adult Migraine prophylaxis: SubQ: Initial: 70 mg once a month; some patients may benefit from 140 mg once a month Missed dose: Administer missed dose as soon as possible, and schedule next dose for 1 month from date of the last dose. Dosing: Renal Impairment: Adult There are no dosage adjustments provided in the manufacturer's labeling (has not been studied); renal impairment is not expected to change the pharmacokinetics of erenumab. Dosing: Hepatic Impairment: Adult There are no dosage adjustments provided in the manufacturer's labeling (has not been studied); hepatic impairment is not expected to change the pharmacokinetics of erenumab. Dosing: Geriatric Refer to adult dosing. Dosage Forms: [place] Excipient information presented when available (limited, particularly for generics); consult specific product labeling. Solution Auto-injector, Subcutaneous [preservative free]:  Aimovig: erenumab-aooe 70 mg/mL (1 mL); erenumab-aooe 140 mg/mL (1 mL) [contains polysorbate 80] Aimovig (140 MG Dose): erenumab-aooe 70 mg/mL (1 mL) [contains polysorbate 80] Generic Equivalent Available: [place] No Dosage Forms: [place] Excipient information presented when available (limited, particularly for generics); consult specific product labeling. Solution Auto-injector, Subcutaneous:  Aimovig: 70 mg/mL (1 mL); 140 mg/mL (1 mL) [contains polysorbate 80] Administration: Adult SubQ: For  subcutaneous use only; intended for self-administration. Keep out of direct sunlight and allow to come to room temperature for 30 minutes before administration. Do not warm using a heat source (eg, hot water, microwave) and do not shake. Administer in abdomen (avoiding 2 inches around the navel), thigh or upper arm, avoiding areas of skin that are tender, bruised, red or hard. Deliver entire contents of single-use autoinjector or prefilled syringe. Use: Labeled Indications Migraine prophylaxis: Preventive treatment of migraine in adults Adverse Reactions 1% to 10%: Gastrointestinal: Constipation (3%) Immunologic: Antibody development (3% to 6%) Local: Injection site reaction (5% to 6%) Neuromuscular & skeletal: Muscle cramps (?2%), muscle spasm (?2%) Frequency not defined: Dermatologic: Injection site pruritus Local: Erythema at injection site, pain at injection site <1%, postmarketing, and/or case reports: Anaphylaxis, angioedema, hypersensitivity reaction Contraindications Serious hypersensitivity to erenumab or any component of the formulation. Warnings/Precautions Concerns related to adverse effects: Marland Kitchen Hypersensitivity: Hypersensitivity reactions, including rash, angioedema, and anaphylaxis, have been reported. Most reactions are mild to moderate and occur within hours after administration, but some may be delayed for >1 week. If a hypersensitivity reaction occurs, discontinue treatment and institute appropriate therapy. Dosage form specific issues: . Latex: The packaging (needle shield of auto-injector and needle cap of prefilled syringe) may contain latex. Metabolism/Transport Effects None known. Drug Interactions   (For additional information: Launch drug interactions program)   There are no known significant interactions. Pregnancy Implications Adverse events were not observed in animal reproduction studies. Breast-Feeding Considerations It is not known if erenumab is present  in breast milk. According to the manufacturer, the decision to breastfeed during therapy should consider the risk of infant exposure, the benefits of breastfeeding to the infant, and benefits of treatment to the mother. Monitoring Parameters Number of monthly migraine days Mechanism of Action Erenumab is a human monoclonal antibody that antagonizes calcitonin gene-related peptide (CGRP) receptor function. Pharmacodynamics and Pharmacokinetics Distribution: Vz: 3.86  L Metabolism: Via a nonspecific, nonsaturable proteolytic pathway Bioavailability: 82% Half-life elimination: 28 days Time to peak: ~6 days Pricing: [place] Solution Auto-injector (Aimovig (140 MG Dose) Subcutaneous) 70 mg/mL (per mL): $345.00 Solution Auto-injector (Aimovig Subcutaneous) 70 mg/mL (per mL): $690.00 140 mg/mL (per mL): $690.00 Disclaimer: A representative AWP (Average Wholesale Price) price or price range is provided as reference price only. A range is provided when more than one manufacturer's AWP price is available and uses the low and high price reported by the manufacturers to determine the range. The pricing data should be used for benchmarking purposes only, and as such should not be used alone to set or adjudicate any prices for reimbursement or purchasing functions or considered to be an exact price for a single product and/or manufacturer. [employer] expressly disclaims all warranties of any kind or nature, whether express or implied, and assumes no liability with respect to accuracy of price or price range data published in its solutions. In no event shall [employer] be liable for special, indirect, incidental, or consequential damages arising from use of price or price range data. Pricing data is updated monthly. Brand Names: International  Aimovig (AT, AU, CZ, EE, GB, HR, HU, LT, LV, NO, PT, RO, SK)       Amitriptyline tablets What is this medicine? AMITRIPTYLINE (a mee TRIP ti leen) is used to treat  depression, migraines or insomnia This medicine may be used for other purposes; ask your health care provider or pharmacist if you have questions. COMMON BRAND NAME(S): Elavil, Vanatrip What should I tell my health care provider before I take this medicine? They need to know if you have any of these conditions: -an alcohol problem -asthma, difficulty breathing -bipolar disorder or schizophrenia -difficulty passing urine, prostate trouble -glaucoma -heart disease or previous heart attack -liver disease -over active thyroid -seizures -thoughts or plans of suicide, a previous suicide attempt, or family history of suicide attempt -an unusual or allergic reaction to amitriptyline, other medicines, foods, dyes, or preservatives -pregnant or trying to get pregnant -breast-feeding How should I use this medicine? Take this medicine by mouth with a drink of water. Follow the directions on the prescription label. You can take the tablets with or without food. Take your medicine at regular intervals. Do not take it more often than directed. Do not stop taking this medicine suddenly except upon the advice of your doctor. Stopping this medicine too quickly may cause serious side effects or your condition may worsen. A special MedGuide will be given to you by the pharmacist with each prescription and refill. Be sure to read this information carefully each time. Talk to your pediatrician regarding the use of this medicine in children. Special care may be needed. Overdosage: If you think you have taken too much of this medicine contact a poison control center or emergency room at once. NOTE: This medicine is only for you. Do not share this medicine with others. What if I miss a dose? If you miss a dose, take it as soon as you can. If it is almost time for your next dose, take only that dose. Do not take double or extra doses. What may interact with this medicine? Do not take this medicine with any of the  following medications: -arsenic trioxide -certain medicines used to regulate abnormal heartbeat or to treat other heart conditions -cisapride -droperidol -halofantrine -linezolid -MAOIs like Carbex, Eldepryl, Marplan, Nardil, and Parnate -methylene blue -other medicines for mental depression -phenothiazines like perphenazine, thioridazine and chlorpromazine -pimozide -  probucol -procarbazine -sparfloxacin -St. John's Wort -ziprasidone This medicine may also interact with the following medications: -atropine and related drugs like hyoscyamine, scopolamine, tolterodine and others -barbiturate medicines for inducing sleep or treating seizures, like phenobarbital -cimetidine -disulfiram -ethchlorvynol -thyroid hormones such as levothyroxine This list may not describe all possible interactions. Give your health care provider a list of all the medicines, herbs, non-prescription drugs, or dietary supplements you use. Also tell them if you smoke, drink alcohol, or use illegal drugs. Some items may interact with your medicine. What should I watch for while using this medicine? Tell your doctor if your symptoms do not get better or if they get worse. Visit your doctor or health care professional for regular checks on your progress. Because it may take several weeks to see the full effects of this medicine, it is important to continue your treatment as prescribed by your doctor. Patients and their families should watch out for new or worsening thoughts of suicide or depression. Also watch out for sudden changes in feelings such as feeling anxious, agitated, panicky, irritable, hostile, aggressive, impulsive, severely restless, overly excited and hyperactive, or not being able to sleep. If this happens, especially at the beginning of treatment or after a change in dose, call your health care professional. Bonita Quin may get drowsy or dizzy. Do not drive, use machinery, or do anything that needs mental  alertness until you know how this medicine affects you. Do not stand or sit up quickly, especially if you are an older patient. This reduces the risk of dizzy or fainting spells. Alcohol may interfere with the effect of this medicine. Avoid alcoholic drinks. Do not treat yourself for coughs, colds, or allergies without asking your doctor or health care professional for advice. Some ingredients can increase possible side effects. Your mouth may get dry. Chewing sugarless gum or sucking hard candy, and drinking plenty of water will help. Contact your doctor if the problem does not go away or is severe. This medicine may cause dry eyes and blurred vision. If you wear contact lenses you may feel some discomfort. Lubricating drops may help. See your eye doctor if the problem does not go away or is severe. This medicine can cause constipation. Try to have a bowel movement at least every 2 to 3 days. If you do not have a bowel movement for 3 days, call your doctor or health care professional. This medicine can make you more sensitive to the sun. Keep out of the sun. If you cannot avoid being in the sun, wear protective clothing and use sunscreen. Do not use sun lamps or tanning beds/booths. What side effects may I notice from receiving this medicine? Side effects that you should report to your doctor or health care professional as soon as possible: -allergic reactions like skin rash, itching or hives, swelling of the face, lips, or tongue -anxious -breathing problems -changes in vision -confusion -elevated mood, decreased need for sleep, racing thoughts, impulsive behavior -eye pain -fast, irregular heartbeat -feeling faint or lightheaded, falls -feeling agitated, angry, or irritable -fever with increased sweating -hallucination, loss of contact with reality -seizures -stiff muscles -suicidal thoughts or other mood changes -tingling, pain, or numbness in the feet or hands -trouble passing urine or  change in the amount of urine -trouble sleeping -unusually weak or tired -vomiting -yellowing of the eyes or skin Side effects that usually do not require medical attention (report to your doctor or health care professional if they continue or are bothersome): -change in  sex drive or performance -change in appetite or weight -constipation -dizziness -dry mouth -nausea -tired -tremors -upset stomach This list may not describe all possible side effects. Call your doctor for medical advice about side effects. You may report side effects to FDA at 1-800-FDA-1088. Where should I keep my medicine? Keep out of the reach of children. Store at room temperature between 20 and 25 degrees C (68 and 77 degrees F). Throw away any unused medicine after the expiration date. NOTE: This sheet is a summary. It may not cover all possible information. If you have questions about this medicine, talk to your doctor, pharmacist, or health care provider.  2018 Elsevier/Gold Standard (2016-04-08 12:14:15)

## 2018-08-16 ENCOUNTER — Encounter

## 2018-08-16 ENCOUNTER — Ambulatory Visit: Payer: Medicare Other | Admitting: Internal Medicine

## 2019-01-21 ENCOUNTER — Telehealth: Payer: Self-pay | Admitting: Obstetrics and Gynecology

## 2019-01-21 NOTE — Telephone Encounter (Signed)
[place] and would like the results of her colonoscopy from 2014 given to her over the phone. [phone]

## 2019-01-21 NOTE — Telephone Encounter (Signed)
Spoke with patient, advised per review of Epic, last colonoscopy 05/28/13 by Dr. Roxan Hockey. Advised patient to f/u with Dr. Roxan Hockey to further review colonoscopy results and request records, if needed.    [facility])  [address], [place] [address]  [phone]  Gevena Barre, MD  [address]  [address]  [place], [place] [address]   Routing to provider for final review. Patient is agreeable to disposition. Will close encounter.

## 2019-07-24 IMAGING — MR MR HEAD WO/W CM
12 series · 48 of 48 positions shown · IV contrast (multihance)
Comparison: Thyroid ultrasound 10/30/2009.

CLINICAL DATA: 67-year-old female with dizziness and headaches.

Creatinine was obtained on site at [HOSPITAL] at [HOSPITAL].
Results: Creatinine 0.8 mg/dL.
EXAM:
MRI HEAD WITHOUT AND WITH CONTRAST
TECHNIQUE: Multiplanar, multiecho pulse sequences of the brain and surrounding
structures were obtained without and with intravenous contrast.
CONTRAST:  10mL MULTIHANCE GADOBENATE DIMEGLUMINE 529 MG/ML IV SOLN

[Series 5: T1 · sagittal · 4.0mm · 0.75mm/px · 2 of 31 slices shown (1 of 3)]
[im 1/31]
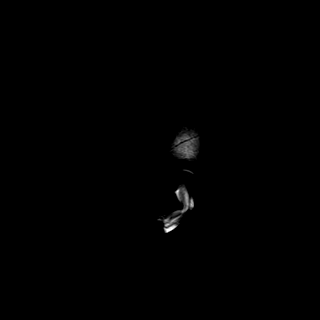
[im 31/31]
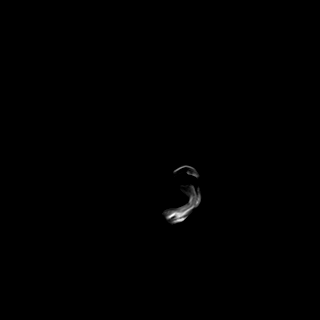

[Series 6: DWI · axial · 3.0mm · 1.44mm/px · z∈[-85,+53]mm · 5 of 86 slices shown (1 of 4)]
[im 1/86]
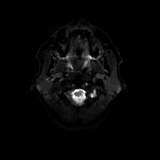
[im 22/86]
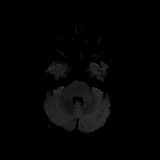
[im 43/86]
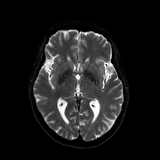
[im 64/86]
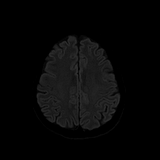
[im 86/86]
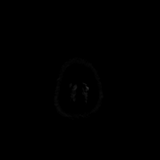

[Series 7: DWI · axial · 3.0mm · 1.44mm/px · z∈[-85,+53]mm · 3 of 42 slices shown (2 of 4)]
[im 1/42]
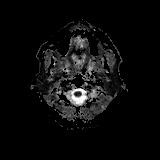
[im 21/42]
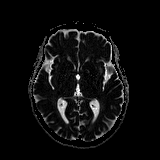
[im 42/42]
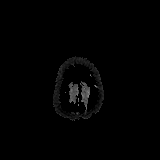

[Series 8: DWI · coronal · 5.0mm · 1.44mm/px · 4 of 60 slices shown (3 of 4)]
[im 1/60]
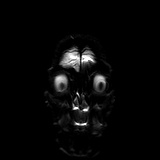
[im 20/60]
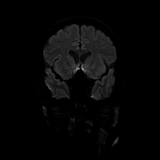
[im 40/60]
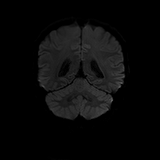
[im 60/60]
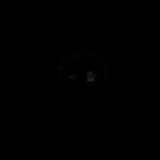

[Series 9: DWI · coronal · 5.0mm · 1.44mm/px · 2 of 30 slices shown (4 of 4)]
[im 1/30]
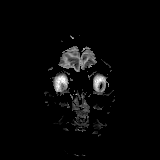
[im 30/30]
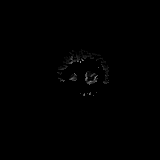

[Series 10: T2 · axial · 4.0mm · 0.36mm/px · z∈[-84,+51]mm · 2 of 27 slices shown]
[im 1/27]
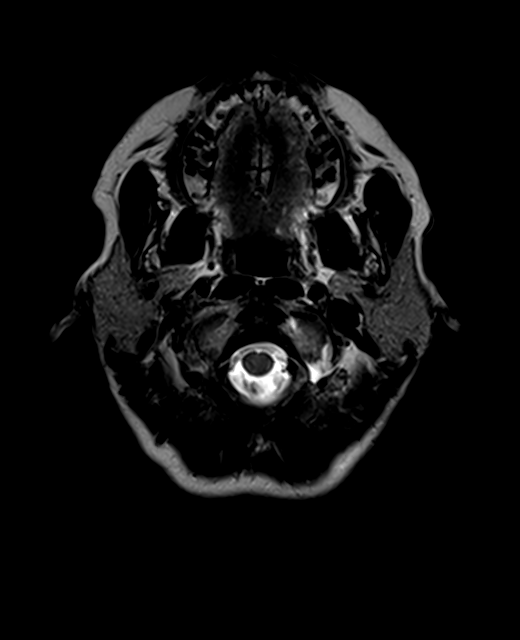
[im 27/27]
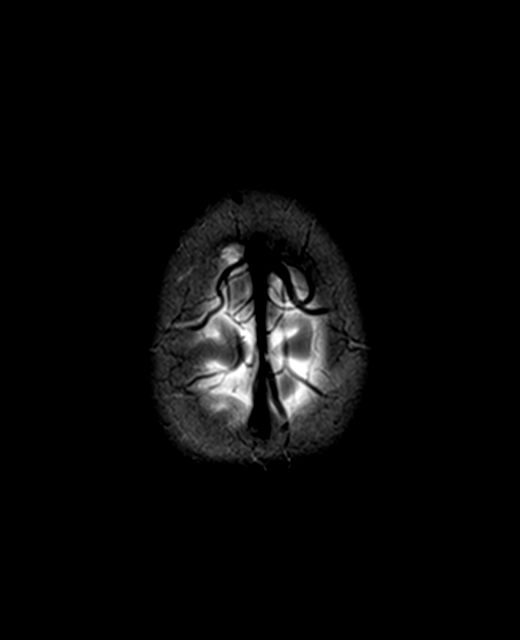

[Series 11: FLAIR · axial · 3.0mm · 0.72mm/px · z∈[-93,+57]mm · 2 of 26 slices shown]
[im 1/26]
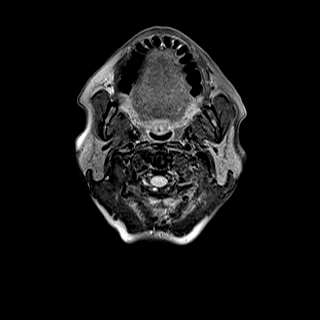
[im 26/26]
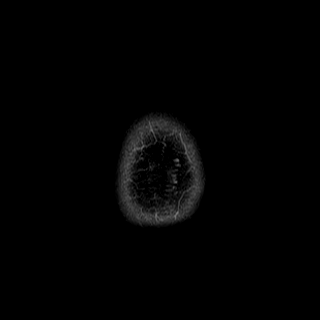

[Series 13: swi_images · axial · 1.5mm · 0.90mm/px · z∈[-87,+55]mm · 6 of 96 slices shown]
[im 1/96]
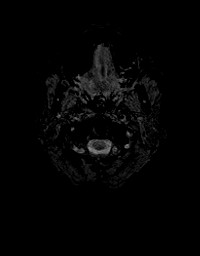
[im 20/96]
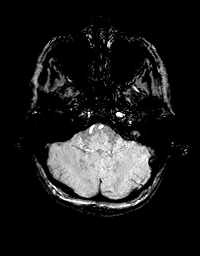
[im 39/96]
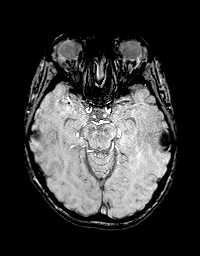
[im 58/96]
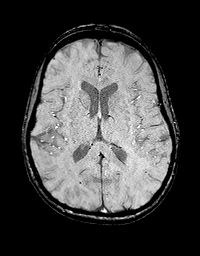
[im 77/96]
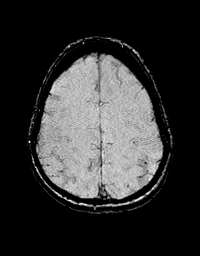
[im 96/96]
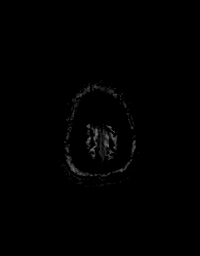

[Series 14: T1 · axial · 1.0mm · 0.90mm/px · z∈[-87,+55]mm · 9 of 144 slices shown (2 of 3)]
[im 1/144]
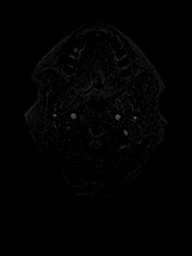
[im 18/144]
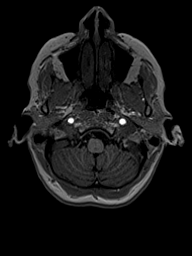
[im 36/144]
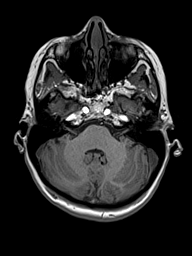
[im 54/144]
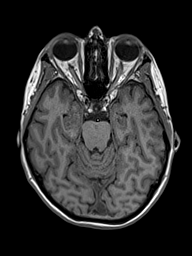
[im 72/144]
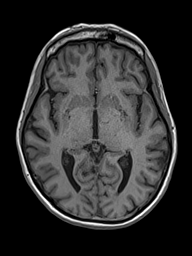
[im 90/144]
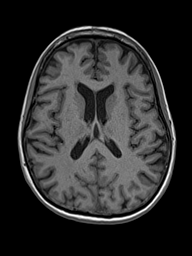
[im 108/144]
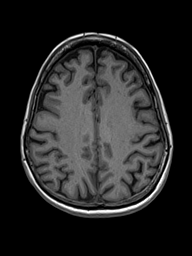
[im 126/144]
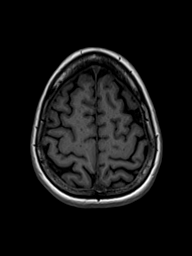
[im 144/144]
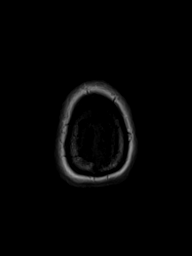

[Series 15: T2 post-contrast · coronal · 4.0mm · 0.36mm/px · 2 of 33 slices shown]
[im 1/33]
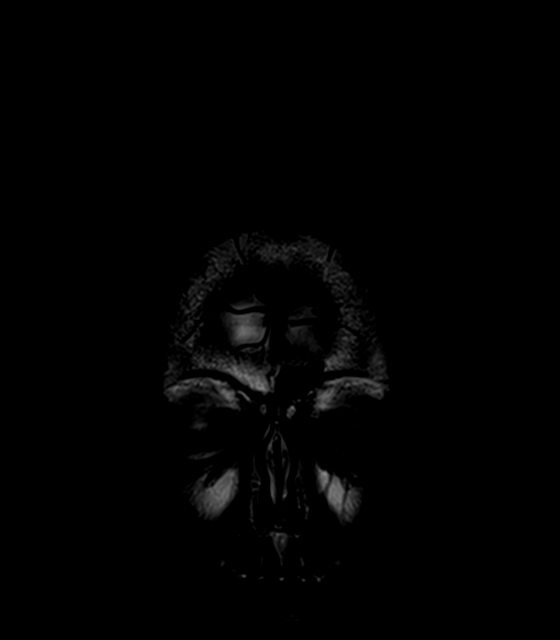
[im 33/33]
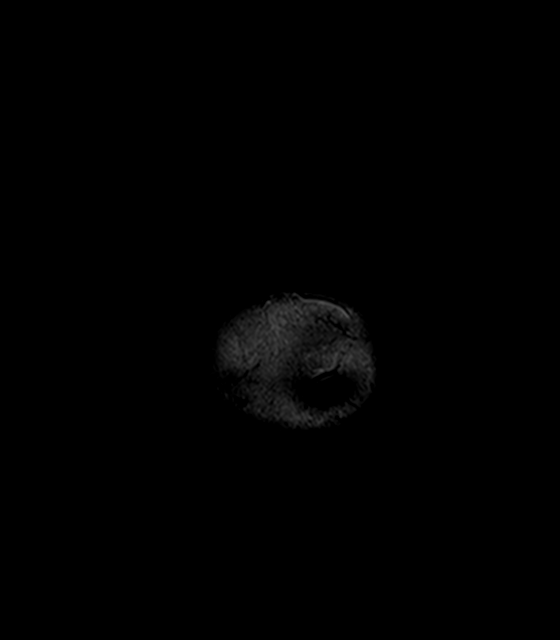

[Series 16: T1 · axial · 1.0mm · 0.90mm/px · z∈[-87,+55]mm · 9 of 144 slices shown (3 of 3)]
[im 1/144]
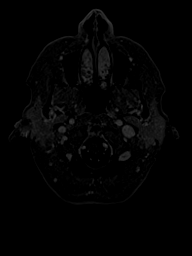
[im 18/144]
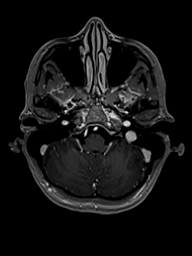
[im 36/144]
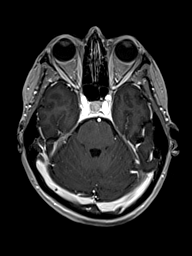
[im 54/144]
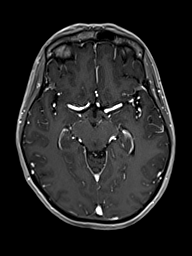
[im 72/144]
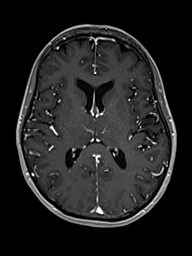
[im 90/144]
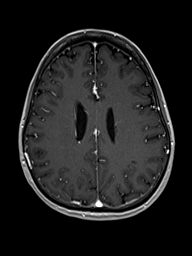
[im 108/144]
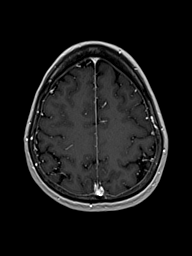
[im 126/144]
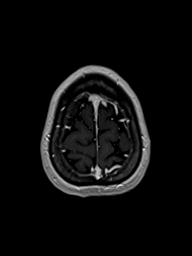
[im 144/144]
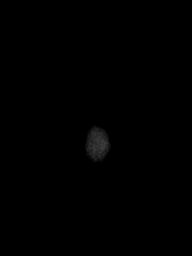

[Series 17: T1 post-contrast · coronal · 4.0mm · 0.72mm/px · 2 of 33 slices shown]
[im 1/33]
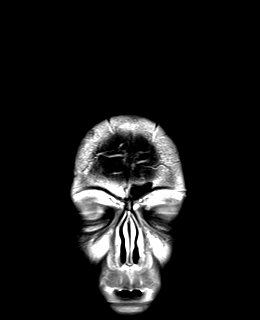
[im 33/33]
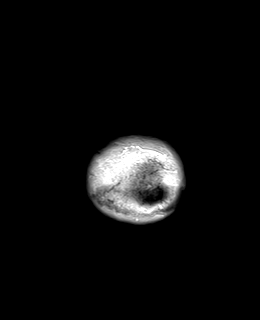

[48 of 48 positions shown; findings below may reference images not displayed]

FINDINGS: Brain: Cerebral volume is within normal limits for age. No
restricted diffusion to suggest acute infarction. No midline shift,
mass effect, evidence of mass lesion, ventriculomegaly, extra-axial
collection or acute intracranial hemorrhage. Cervicomedullary
junction and pituitary are within normal limits.

Gray and white matter signal is largely normal throughout the brain.
No cortical encephalomalacia. There is evidence of a subtle chronic
microhemorrhage in the right cerebellum on series 13, image 15. But
the cerebellum otherwise appears normal. Similar tiny chronic
microhemorrhage in the right superior occipital lobe on series 13,
image 58. No other chronic cerebral blood products. No abnormal
enhancement identified. No dural thickening.

Vascular: Major intracranial vascular flow voids are preserved. The
major dural venous sinuses are enhancing and appear patent.

Skull and upper cervical spine: Negative visible cervical spine.
Normal bone marrow signal.

Sinuses/Orbits: Normal orbits soft tissues. The paranasal sinuses
are clear.

Other: Mastoid air cells are clear. Visible internal auditory
structures appear normal. Scalp and face soft tissues appear
negative.
IMPRESSION: 1. No acute intracranial abnormality and largely normal for age MRI
appearance of the brain.
2. Two tiny chronic micro-hemorrhages in the right cerebellum and
occipital lobe are nonspecific.
